# Patient Record
Sex: Female | Born: 1955 | Race: Black or African American | Hispanic: No | Marital: Single | Smoking: Never smoker
Health system: Southern US, Community
[De-identification: ages and names within clinical notes are randomized; demographics above are authoritative.]

## PROBLEM LIST (undated history)

## (undated) DIAGNOSIS — K219 Gastro-esophageal reflux disease without esophagitis: Secondary | ICD-10-CM

## (undated) DIAGNOSIS — D649 Anemia, unspecified: Secondary | ICD-10-CM

## (undated) HISTORY — PX: DILATION AND CURETTAGE OF UTERUS: SHX78

## (undated) HISTORY — PX: MYOMECTOMY: SHX85

---

## 1998-12-14 ENCOUNTER — Ambulatory Visit (HOSPITAL_COMMUNITY): Admission: RE | Admit: 1998-12-14 | Discharge: 1998-12-14 | Payer: Self-pay | Admitting: Obstetrics and Gynecology

## 1998-12-14 ENCOUNTER — Encounter: Payer: Self-pay | Admitting: Obstetrics and Gynecology

## 1999-06-12 ENCOUNTER — Other Ambulatory Visit: Admission: RE | Admit: 1999-06-12 | Discharge: 1999-06-12 | Payer: Self-pay | Admitting: Obstetrics and Gynecology

## 1999-08-18 ENCOUNTER — Ambulatory Visit (HOSPITAL_COMMUNITY): Admission: RE | Admit: 1999-08-18 | Discharge: 1999-08-18 | Payer: Self-pay | Admitting: Obstetrics and Gynecology

## 1999-11-21 ENCOUNTER — Other Ambulatory Visit: Admission: RE | Admit: 1999-11-21 | Discharge: 1999-11-21 | Payer: Self-pay | Admitting: Obstetrics and Gynecology

## 1999-12-17 ENCOUNTER — Encounter: Payer: Self-pay | Admitting: Obstetrics and Gynecology

## 1999-12-17 ENCOUNTER — Ambulatory Visit (HOSPITAL_COMMUNITY): Admission: RE | Admit: 1999-12-17 | Discharge: 1999-12-17 | Payer: Self-pay | Admitting: Obstetrics and Gynecology

## 2000-08-28 ENCOUNTER — Inpatient Hospital Stay (HOSPITAL_COMMUNITY): Admission: RE | Admit: 2000-08-28 | Discharge: 2000-08-30 | Payer: Self-pay | Admitting: Obstetrics and Gynecology

## 2000-08-28 ENCOUNTER — Encounter (INDEPENDENT_AMBULATORY_CARE_PROVIDER_SITE_OTHER): Payer: Self-pay

## 2001-07-15 ENCOUNTER — Other Ambulatory Visit: Admission: RE | Admit: 2001-07-15 | Discharge: 2001-07-15 | Payer: Self-pay | Admitting: Obstetrics and Gynecology

## 2002-04-13 ENCOUNTER — Encounter: Payer: Self-pay | Admitting: Internal Medicine

## 2002-04-13 ENCOUNTER — Ambulatory Visit (HOSPITAL_COMMUNITY): Admission: RE | Admit: 2002-04-13 | Discharge: 2002-04-13 | Payer: Self-pay | Admitting: Internal Medicine

## 2002-05-10 ENCOUNTER — Ambulatory Visit (HOSPITAL_COMMUNITY): Admission: RE | Admit: 2002-05-10 | Discharge: 2002-05-10 | Payer: Self-pay | Admitting: Internal Medicine

## 2002-05-10 ENCOUNTER — Encounter: Payer: Self-pay | Admitting: Internal Medicine

## 2003-02-07 ENCOUNTER — Other Ambulatory Visit: Admission: RE | Admit: 2003-02-07 | Discharge: 2003-02-07 | Payer: Self-pay | Admitting: Obstetrics and Gynecology

## 2003-02-16 ENCOUNTER — Encounter: Payer: Self-pay | Admitting: Obstetrics and Gynecology

## 2003-02-16 ENCOUNTER — Ambulatory Visit (HOSPITAL_COMMUNITY): Admission: RE | Admit: 2003-02-16 | Discharge: 2003-02-16 | Payer: Self-pay | Admitting: Obstetrics and Gynecology

## 2005-03-12 ENCOUNTER — Other Ambulatory Visit: Admission: RE | Admit: 2005-03-12 | Discharge: 2005-03-12 | Payer: Self-pay | Admitting: Obstetrics and Gynecology

## 2005-03-19 ENCOUNTER — Ambulatory Visit (HOSPITAL_COMMUNITY): Admission: RE | Admit: 2005-03-19 | Discharge: 2005-03-19 | Payer: Self-pay | Admitting: Obstetrics and Gynecology

## 2005-06-11 ENCOUNTER — Encounter (INDEPENDENT_AMBULATORY_CARE_PROVIDER_SITE_OTHER): Payer: Self-pay | Admitting: *Deleted

## 2005-06-11 ENCOUNTER — Ambulatory Visit (HOSPITAL_COMMUNITY): Admission: RE | Admit: 2005-06-11 | Discharge: 2005-06-11 | Payer: Self-pay | Admitting: Obstetrics and Gynecology

## 2007-01-14 ENCOUNTER — Ambulatory Visit (HOSPITAL_COMMUNITY): Admission: RE | Admit: 2007-01-14 | Discharge: 2007-01-14 | Payer: Self-pay | Admitting: Obstetrics and Gynecology

## 2007-07-29 ENCOUNTER — Ambulatory Visit (HOSPITAL_COMMUNITY): Admission: RE | Admit: 2007-07-29 | Discharge: 2007-07-29 | Payer: Self-pay | Admitting: Internal Medicine

## 2008-04-12 ENCOUNTER — Ambulatory Visit (HOSPITAL_COMMUNITY): Admission: RE | Admit: 2008-04-12 | Discharge: 2008-04-12 | Payer: Self-pay | Admitting: Obstetrics and Gynecology

## 2010-02-01 IMAGING — MG MM DIGITAL SCREENING BILAT
4 series · 4 of 4 positions shown · non-contrast
Comparison: Prior studies.

DG SCREEN MAMMOGRAM BILATERAL
Bilateral CC and MLO view(s) were taken.
Technologist: Flor, Raffy.(ROSELLON)(M)
Prior study comparison: March 19, 2005, bilateral screening mammogram.

DIGITAL SCREENING MAMMOGRAM WITH CAD:

[R CC]
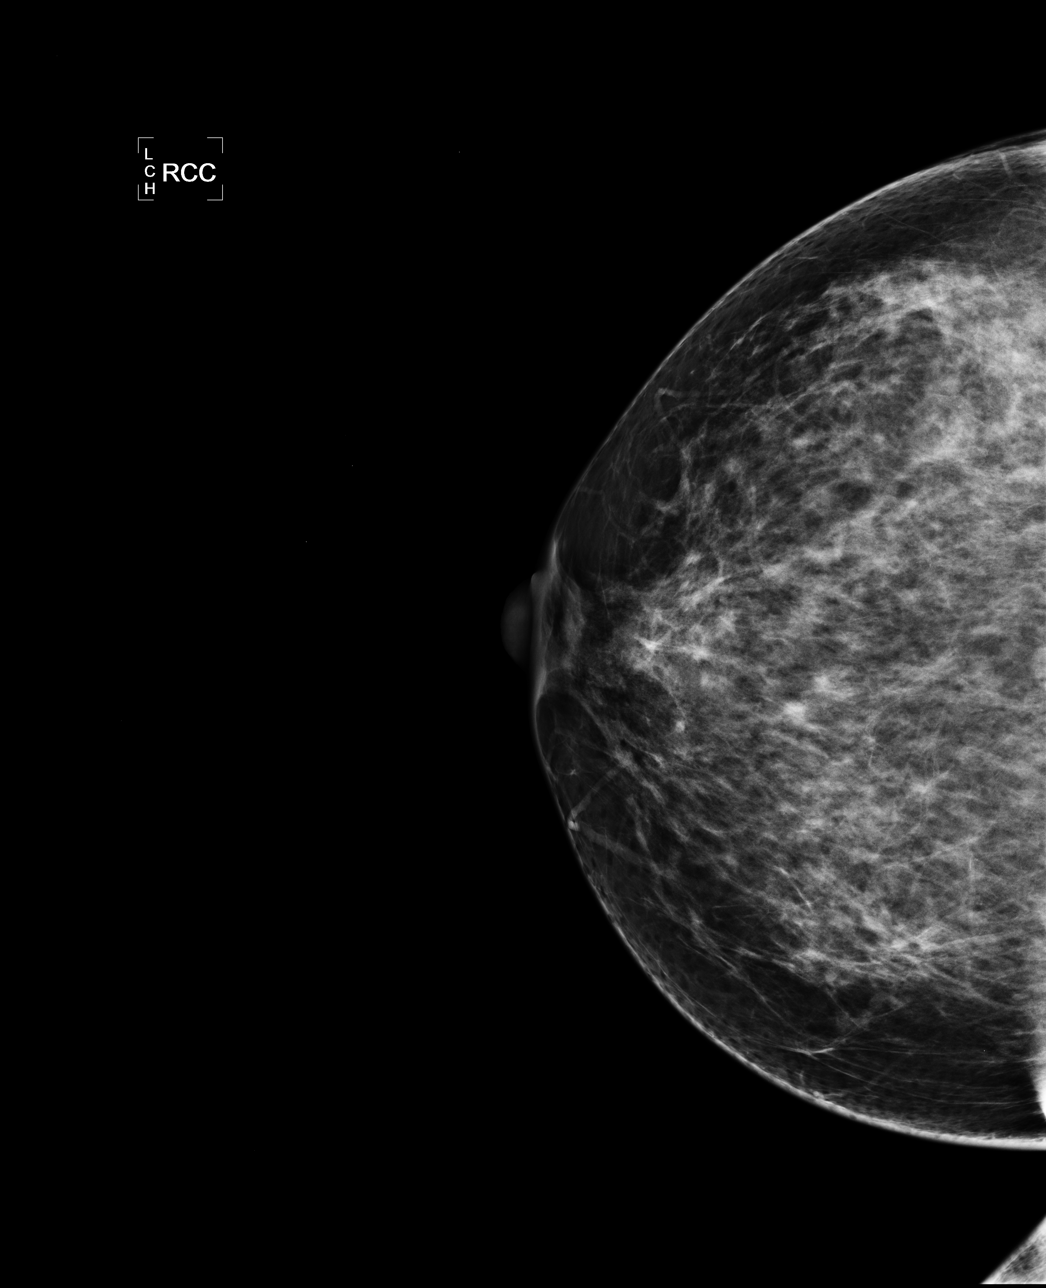

[R MLO]
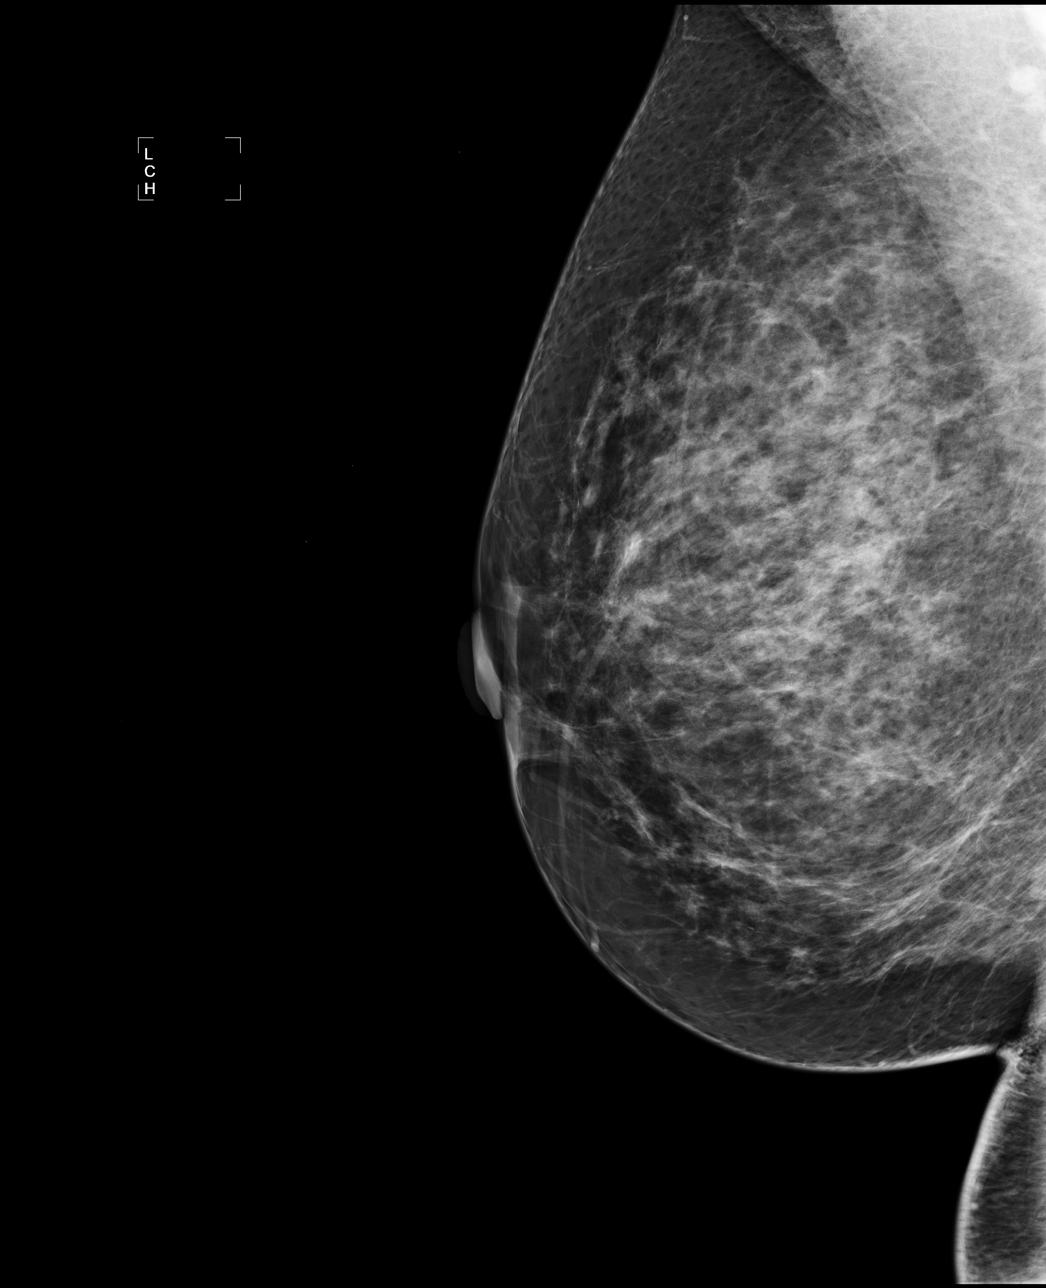

[L CC]
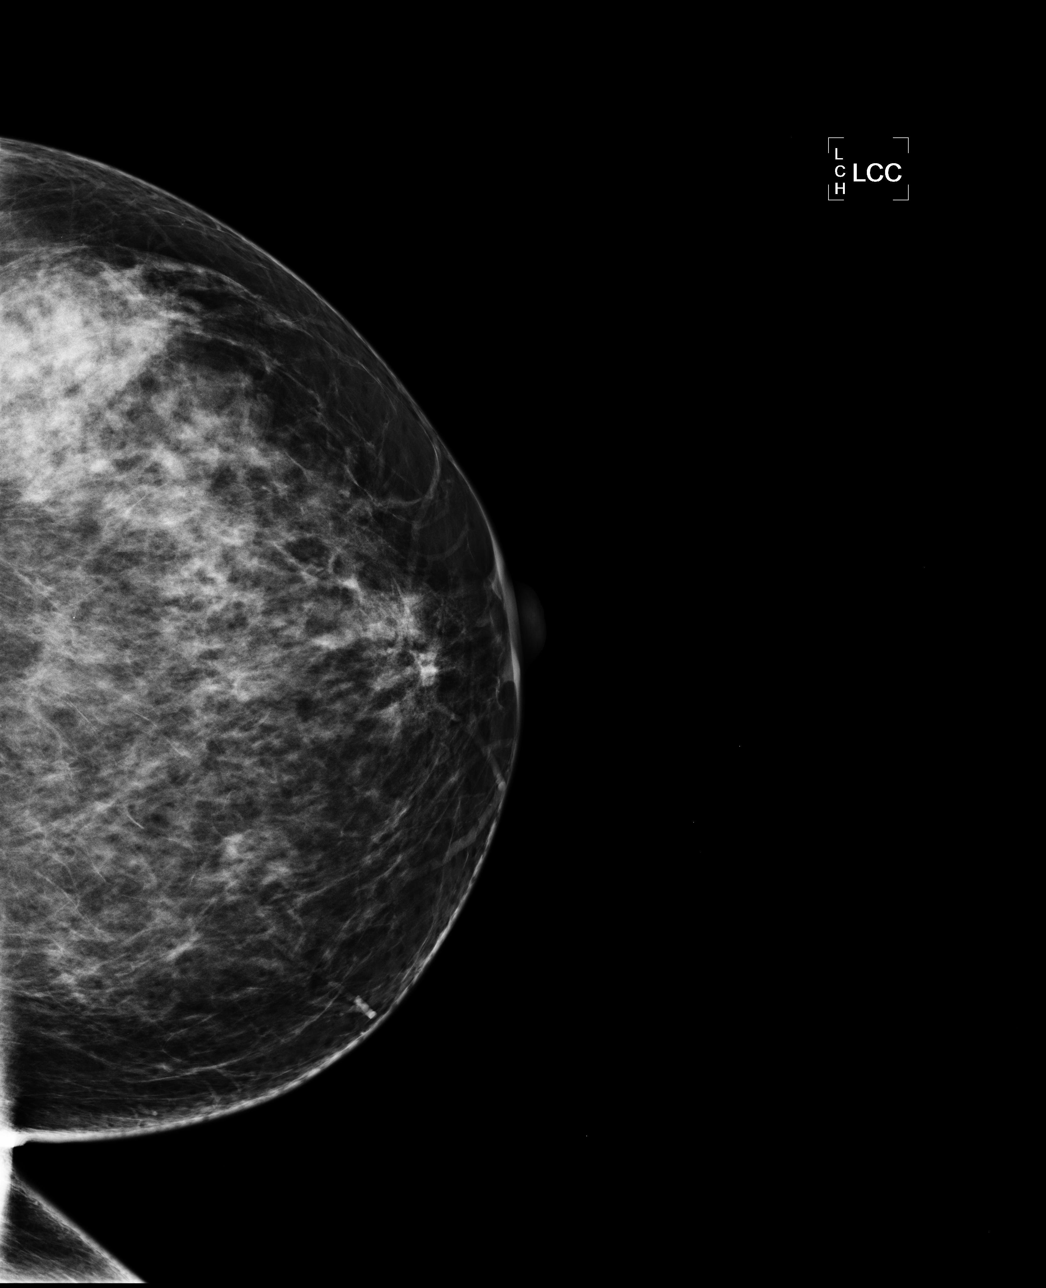

[L MLO]
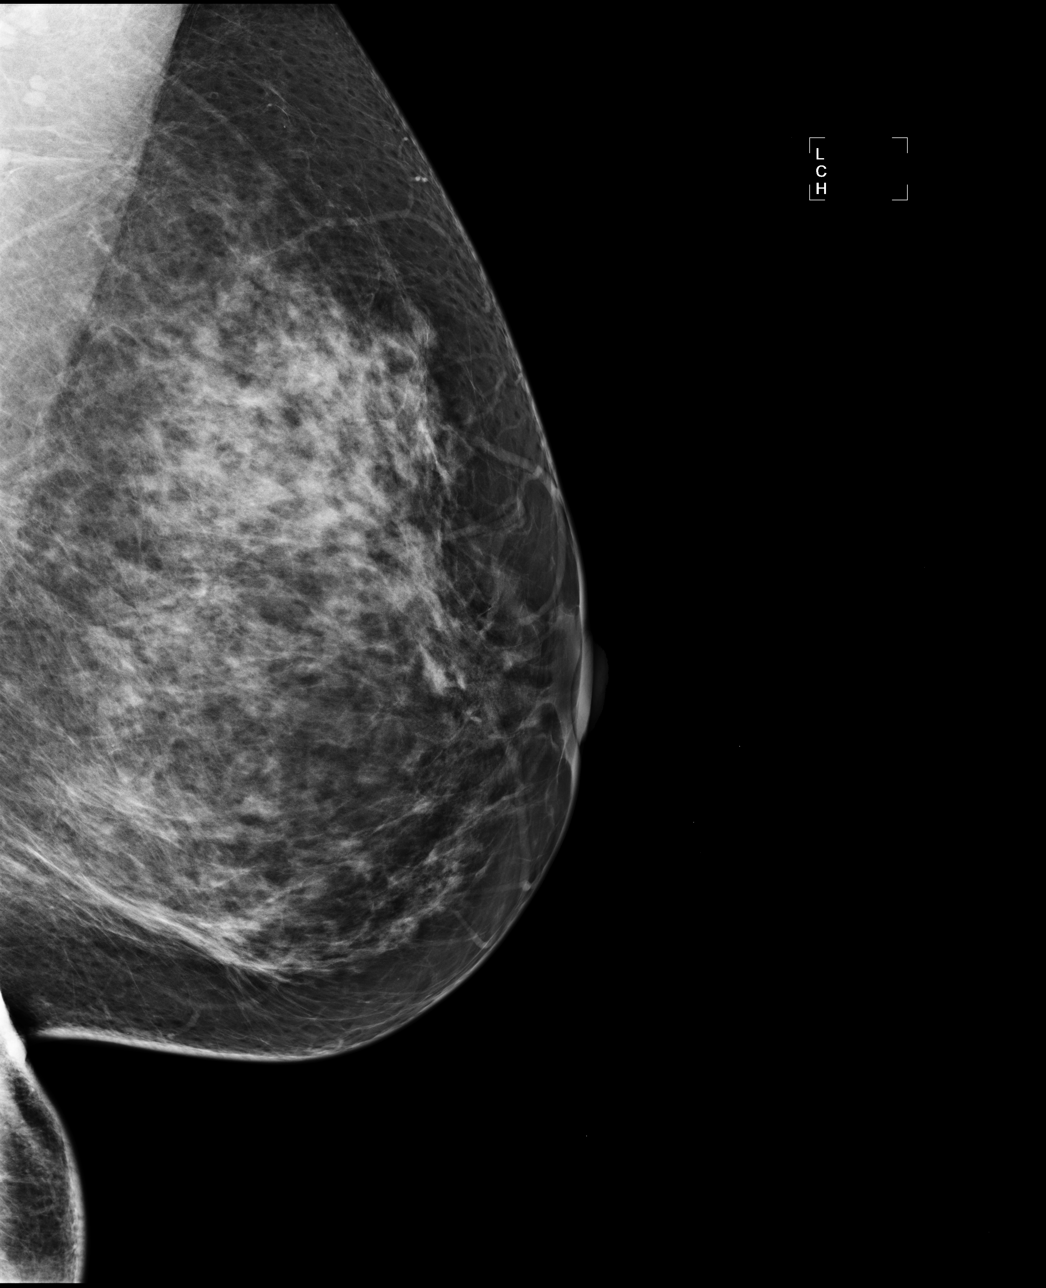

[4 of 4 positions shown; findings below may reference images not displayed]

The breast tissue is heterogeneously dense.  There is no dominant mass, architectural distortion or
calcification to suggest malignancy.
IMPRESSION: No mammographic evidence of malignancy.  Suggest yearly screening mammography.

ASSESSMENT: Negative - BI-RADS 1

Screening mammogram in 1 year.
ANALYZED BY COMPUTER AIDED DETECTION. , THIS PROCEDURE WAS A DIGITAL MAMMOGRAM.

## 2010-06-15 ENCOUNTER — Ambulatory Visit (HOSPITAL_COMMUNITY): Admission: RE | Admit: 2010-06-15 | Discharge: 2010-06-15 | Payer: Self-pay | Admitting: Obstetrics and Gynecology

## 2011-01-18 NOTE — H&P (Signed)
[facility]  Patient:    [name], [name]                 MRN: [id number] Adm. Date:  07/01/00 Attending:  Janine Limbo, M.D.                         History and Physical  HISTORY OF PRESENT ILLNESS:   The patient is a 55 year old female, para 0-0-2-0 who presents for abdominal myomectomy because of an enlarging fibroid uterus.  The patient had a normal Pap smear in March 2001.  Her most recent mammogram was in April 2001 and was within normal limits.  The patient had an endometrial biopsy performed that was benign.  The patient desires to maintain her childbearing potential, although she is not interested in getting pregnant in the immediate future.  The patient was seen in March 2001 and her uterus was noted to be 16 weeks in size, irregular, and firm.  The patient was then seen six months later and her uterus was noted to be 20-22 weeks in size. There is a large fibroid projecting above the umbilicus.  Her most recent hemoglobin was 10.0, but her hemoglobin has been as low as 6.1.  She has heavy menstrual cycles.  She denies any prior history of sexually transmitted infections.  DRUG ALLERGIES:               None.  PAST OBSTETRIC HISTORY:       The patient has had two elective pregnancy terminations.  PAST MEDICAL HISTORY:         The patient denies hypertension and diabetes.  SOCIAL HISTORY:               The patient is single.  She works as an Art gallery manager.  She drinks alcohol socially.  She denies cigarette use and recreational drug use.  REVIEW OF SYSTEMS:            Noncontributory.  FAMILY HISTORY:               The patients mother has hypertension.  PHYSICAL EXAMINATION:  VITAL SIGNS:                  Weight 183 pounds, height 5 feet 7 inches.  HEENT:                        Within normal limits.  CHEST:                        Clear.  HEART:                        Regular rate and rhythm.  BREASTS:                       Without masses.  ABDOMEN:                      Nontender.  There is a firm mass present that projects approximately 5 cm above the umbilicus.  EXTREMITIES:                  Within normal limits.  NEUROLOGIC:                   Normal.  PELVIC:  External genitalia normal.  The vagina is normal.  The cervix is nontender.  The uterus is 22 week sized, irregular and firm.  Adnexa no masses.  Rectovaginal exam confirms.  ASSESSMENT:                   1. Enlarging fibroid uterus.                               2. Menorrhagia.                               3. Anemia.  PLAN:                         The patient will undergo an abdominal myomectomy.  She understands the indications for her procedure and she accepts the risks of, but not limited to, anesthesia complications, bleeding, infection and possible damage to surrounding organs. DD:  06/15/00 TD:  06/15/00 Job: [id number]

## 2011-01-18 NOTE — Discharge Summary (Signed)
[facility]  Patient:    [name], [name]               MRN: [id number] Adm. Date:  [id number] Disc. Date: [id number] Attending:  Leonard Schwartz Dictator:   Henreitta Leber, P.A.                           Discharge Summary  DISCHARGE DIAGNOSES:          1. Enlarging fibroid uterus (22 week size).                               2. Menorrhagia.                               3. Anemia.  PROCEDURE:                    On August 28, 2000 the patient underwent an abdominal myomectomy, tolerating the procedure well.  HISTORY OF PRESENT ILLNESS:   [name] is a 55 year old female, para 0-0-2-0, who presents for myomectomy because of an enlarging fibroid uterus. The patient has also been symptomatic for menorrhagia but wishes to maintain her child bearing potential. Please see the patients dictated history and physical exam for details.  PHYSICAL EXAMINATION:  VITAL SIGNS:                  Weight is 183 pounds. Height is 5 feet 7inches tall.  GENERAL:                      Within normal limits with the exception of her abdominal exam which is nontender; however, there is a firm mass present that projects above the umbilicus approximately 5 cm.  PELVIC:                       EGBUS is within normal limits. The vagina is normal. The cervix is nontender. Uterus is approximately 22 week size, irregular and firm. Adnexa without masses. Rectovaginal exam confirms.  HOSPITAL COURSE:              On August 28, 2000 the patient underwent an abdominal myomectomy at which time approximately 18 uterine fibroids were removed with the largest measuring approximately 10 x 7 x 5 cm; the patient tolerated the procedure well. Postoperative course was marked by a maximum temperature of 101.6 degrees Fahrenheit on postoperative day #1; however, this was attributed to the patients procedure. The patients postoperative hemoglobin was 8.1 (preoperative  hemoglobin 10.3). On postoperative day #2 the patient was believed to have received the maximum benefit of her hospital stay with her resuming a normal temperature, bowel and bladder functions, and therefore discharged to home.  DISCHARGE MEDICATIONS:        1. Vicodin one to two tablets every four to six                                  hours as needed for pain.                               2. Ibuprofen 600 mg one tablet every six hours  with food for five days.                               3. Iron 325 mg one tablet three times a day for                                  six weeks.                               4. Stool softeners 60 mg one tablet twice daily                                  while taking iron.                               5. Phenergan 25 mg one tablet four times a day                                  as needed for nausea.  FOLLOWUP INSTRUCTIONS:        The patient is to call [facility] for staple removal on September 01, 2000. She is also to schedule a six weeks postoperative appointment with Dr. Leonard Schwartz.  DISCHARGE INSTRUCTIONS:       The patient was given a copy of [address] [facility] postoperative instruction sheet. She was further advised to avoid driving for two weeks, heavy lifting for four weeks, and intercourse for six weeks.  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 DD:  09/21/00 TD:  09/22/00 Job: [id number]

## 2011-01-18 NOTE — H&P (Signed)
NAMEGENNELL, HOW NO.:  [id number]   MEDICAL RECORD NO.:  [id number]          PATIENT TYPE:  AMB   LOCATION:  SDC                           FACILITY:  [facility]   PHYSICIAN:  Janine Limbo, M.D.DATE OF BIRTH:  [birth year]   DATE OF ADMISSION:  DATE OF DISCHARGE:                                HISTORY & PHYSICAL   HISTORY OF PRESENT ILLNESS:  Ms. Miles is a 55 year old female, para 0-  0-2-0 who presents for hysteroscopy with dilatation and curettage.  The  patient has a history of heavy and irregular bleeding.  The patient also has  a known history of fibroids.  The patient had a myomectomy in 2001.  Her  most recent hemoglobin was 7.1.  Her hematocrit was 26.8.  Her thyroid tests  were normal.  GC and Chlamydia cultures were negative.  Her most recent Pap  smear was within normal limits.  The patient does desire to maintain her  childbearing potential.   ALLERGIES:  No known drug allergies.   OBSTETRICAL HISTORY:  The patient has had two elective pregnancy  terminations.   PAST MEDICAL HISTORY:  The patient denies hypertension and diabetes.   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  She  drinks alcohol socially.  She denies cigarette use and recreational drug  use.   REVIEW OF SYSTEMS:  Noncontributory.   FAMILY HISTORY:  The patient's mother has hypertension.   PHYSICAL EXAMINATION:  VITAL SIGNS:  Height is 5 feet 7 inches, weight is  185 pounds.  HEENT:  Within normal limits.  CHEST:  Clear.  HEART:  Regular rate and rhythm.  BREASTS:  Without masses.  ABDOMEN:  Nontender.  EXTREMITIES:  Within normal limits.  NEUROLOGIC:  Exam is grossly normal.  PELVIC:  External genitalia are normal.  Vagina is normal.  Cervix is  nontender.  Uterus is 14 weeks' size, irregular.  Adnexa:  No masses.  Rectovaginal exam confirms.   ASSESSMENT:  1.  Menorrhagia.  2.  Severe anemia.  3.  Fibroids.   PLAN:  The patient will  undergo a hysteroscopy with dilatation and  curettage.  She understands the indications for her surgical procedure and  she accepts the risks of, but not limited to, anesthetic complications,  bleeding, infections, and possible damage to the surrounding organs.      Janine Limbo, M.D.  Electronically Signed     AVS/MEDQ  D:  06/10/2005  T:  06/10/2005  Job:  [id number]

## 2011-01-18 NOTE — Op Note (Signed)
[facility]  Patient:    [name]                MRN: [id number] Proc. Date: 08/18/99 Adm. Date:  [id number] Attending:  Leonard Schwartz                           Operative Report  PREOPERATIVE DIAGNOSES:       1. Menometrorrhagia.                               2. Fibroid.                               3. Severe anemia (hemoglobin 7.3).  POSTOPERATIVE DIAGNOSES:      1. Menometrorrhagia.                               2. Fibroid.                               3. Severe anemia (hemoglobin 7.3).  OPERATION:                    Diagnostic hysteroscopy with dilatation and                               curettage.  SURGEON:                      Janine Limbo, M.D.  ANESTHESIA:                   IV sedation and paracervical block using 1%                               Xylocaine.  INDICATIONS:                  The patient is a 55 year old who presents with the above mentioned diagnosis.  Hormonal replacement therapy had failed to control er symptoms.  The patient understands the indications for her surgical procedure and she accepts the risks of, but not limited to, anesthesia complications, bleeding, infection, and possible damage to the surrounding organs.  FINDINGS:                     The preoperative uterus was noted to be 16-18 weeks size and irregular.  The uterus sounded to 14.5 cm.  There were no polyps in the endometrial cavity.  There was a posterior fibroid that seemed to measure 5 cm.  The tubal ostia were visualized.  DESCRIPTION OF PROCEDURE:     The patient was taken to the operating room where she was given medication through her IV line.  The perineum and vagina were prepped  with Betadine.  The bladder was drained of urine.  Examination of anesthesia was performed.  The patient was sterilely draped.  A paracervical block was placed using 10 cc of 1% Xylocaine without epinephrine.  The endocervical  curettage was then obtained.  The uterus sounded to 14.5 cm.  The cervix was gradually dilated. The uterine cavity was investigated  using the diagnostic hysteroscope. Pictures were taken of the intrauterine environment.  The cavity was then curetted until it was felt to be completely clean.  The postoperative uterus was firm and hemostasis was adequate.  The estimated blood loss was 15 cc.  The Sorbitol deficit was 70 cc. The patient tolerated her procedure well.  All instruments were removed and the  patient was returned to the supine position.  She was taken to the recovery room in stable condition.  FOLLOWUP INSTRUCTIONS:        The patient will return to see Janine Limbo, M.D. in two to three weeks for follow up examination.  She was given Vicodin one to two p.o. q.4h. p.r.n. pain.  She was given a copy of the postoperative instruction sheet _________ by the [facility] for patients who have undergone hysteroscopy and D&C. DD:  08/18/99 TD:  08/21/99 Job: [id number]

## 2011-01-18 NOTE — Op Note (Signed)
[name], [name] NO.:  [id number]   MEDICAL RECORD NO.:  [id number]          PATIENT TYPE:  AMB   LOCATION:  SDC                           FACILITY:  [facility]   PHYSICIAN:  Janine Limbo, M.D.DATE OF BIRTH:  1956-04-10   DATE OF PROCEDURE:  06/11/2005  DATE OF DISCHARGE:                                 OPERATIVE REPORT   PREOPERATIVE DIAGNOSES:  1.  Fibroid uterus.  2.  Menometrorrhagia.  3.  Anemia (hemoglobin 7.0).   POSTOPERATIVE DIAGNOSIS:  1.  Fibroid uterus.  2.  Menometrorrhagia.  3.  Anemia (hemoglobin 7.0).   PROCEDURES:  1.  Hysteroscopy with resection of a submucosal fibroid.  2.  Dilatation and curettage.   SURGEON:  Janine Limbo, M.D.   FIRST ASSISTANT:  None.   ANESTHETIC:  Monitored anesthetic control and paracervical block using 0.5%  Marcaine with epinephrine.   DISPOSITION:  [name] is a 55 year old female, para 0-0-2-0, who  presents with the above-mentioned diagnosis.  The patient understands the  indications for her surgical procedure and she accepts the risks of, but not  limited to, anesthetic complications, bleeding, infection, and possible  damage to the surrounding organs.  The patient does not want to have a  hysterectomy performed.   FINDINGS:  The the patient's uterus was 14-16 weeks' size.  The uterus  sounded to 11 cm.  A 3 x 4 cm submucosal fibroid was noted on hysteroscopy.  There was no evidence of other pathology.   PROCEDURE:  The patient was taken to the operating room, where the patient  was given medication through her IV line.  The patient's perineum and vagina  were prepped with multiple layers of Betadine.  The patient was sterilely  draped.  The bladder was drained of urine.  Examination under anesthesia was  performed.  A paracervical block was then placed using 10 mL of 0.5%  Marcaine with epinephrine.  Another 10 mL of 0.5% Marcaine with epinephrine  were injected directly into the  cervix.  An endocervical curettage was  performed.  The cervix was dilated.  The diagnostic hysteroscope was  inserted and pictures were made of the intrauterine environment.  The  diagnostic hysteroscope was removed and the cervix was further dilated.  The  operative hysteroscope was inserted.  We then began the resection of the  submucosal fibroid.  This was a lengthy procedure, and the procedure was  complicated by bleeding.  Also. the procedure was complicated by the fact  that the small pieces of fibroid had to be removed through the hysteroscope.  The resection lasted approximately 1 hour 45 minutes.  We had removed two-  thirds of the fibroid when we noticed that our fluid deficit registered 600  on the meter.  We did understand that there was approximately 200 mL of  sorbitol on the operating room floor, in the drapes, and on the operator's  clothing.  The decision was made to terminate the procedure at this point.  The final reading on the meter for the sorbitol was 560 mL.  Hemostasis was  adequate.  All instruments were removed.  The  uterus was again examined and there was no evidence of damage.  A Foley  catheter had previously been placed because of the length of the operative  procedure.  The patient was noted to drain clear yellow urine.  The patient  was then returned to the supine position.  Her anesthetic was reversed.  She  was taken to the recovery room in stable condition.  She tolerated her  procedure well.   FOLLOW-UP INSTRUCTIONS:  The patient will take Vicodin one or two tablets  every four hours as needed for pain.  She will return to see Dr. Stefano Gaul in  two to three weeks for follow-up examination.  She was given a copy of the  postoperative instruction sheet as prepared by the [facility]  [facility] for patients who have undergone a dilatation and curettage.  We  will see how the patient does in terms of her bleeding.  If the patient wishes to proceed  with further surgical management, then we  will consider resecting the remainder of the fibroid or we will consider a  myomectomy or hysterectomy.  If the patient does well and her bleeding is  not severe, then we will have the patient take iron tablets and we will  follow her as an outpatient.      Janine Limbo, M.D.  Electronically Signed     AVS/MEDQ  D:  06/11/2005  T:  06/11/2005  Job:  [id number]

## 2011-01-18 NOTE — Discharge Summary (Signed)
[facility]  Patient:    [name], [name]               MRN: [id number] Adm. Date:  [id number] Disc. Date: [id number] Attending:  Leonard Schwartz Dictator:   Marquis Lunch. Adline Peals.                           Discharge Summary  DISCHARGE DIAGNOSES:          1. Enlarging fibroid uterus (22 weeks size).                               2. Menorrhagia.                               3. Anemia.  PROCEDURE:                    On the date of admission patient underwent an abdominal myomectomy, tolerating procedure well.  HISTORY OF PRESENT ILLNESS:   [name] is a 55 year old female para 0-0-2-0 who presents for abdominal myomectomy because of an enlarging fibroid uterus.  Patient has also been symptomatic for menorrhagia, but wishes to maintain her childbearing potential.  Please see patients dictated history and physical examination for details.  PHYSICAL EXAMINATION:  VITAL SIGNS:                  Weight 183 pounds, height 5 feet 7 inches.  GENERAL:                      Within normal limits.  ABDOMEN:                      Nontender.  However, there is a firm mass present that projects above the umbilicus approximately 5 cm.  PELVIC:                       EGBUS is within normal limits.  Vagina is normal.  Cervix is nontender.  Uterus is approximately 22 weeks size, irregular, and firm.  Adnexa is without masses.  Rectovaginal examination confirms.  HOSPITAL COURSE:              On the date of admission patient underwent an abdominal myomectomy at which time approximately 18 uterine fibroids were removed with the largest measuring approximately 10 cm x 7 cm x 5 cm.  Patient tolerated procedure well.  Postoperative course was marked by a maximum temperature of 101.6 degrees Fahrenheit on postoperative day #1, however, this was attributed to patients procedure.  Patients postoperative hemoglobin was 8.1 (preoperative hemoglobin 10.3).   On postoperative day #2 patient was believed to have received the maximum benefit of her hospital stay with her resuming a normal temperature, bowel and bladder functions, and was therefore discharged to home.  DISCHARGE MEDICATIONS:        1. Vicodin one to two tablets q.4-6h. as needed                                  for pain.  2. Ibuprofen 600 mg one tablet q.6h. with food                                  for five days.                               3. Iron 325 mg one tablet t.i.d. for six weeks.                               4. Stool softeners 60 mg one tablet b.i.d. while                                  taking iron.                               5. Phenergan 25 mg one tablet q.i.d. as needed                                  for nausea.  FOLLOW-UP:                    Patient is to call [facility] for staple removal on October 02, 2000.  She is also to schedule a six week postoperative appointment with Dr. Leonard Schwartz.  DISCHARGE INSTRUCTIONS:       Patient was given a copy of [facility] postoperative instruction sheet.  She was further advised to avoid driving for two weeks, heavy lifting for four weeks, and intercourse for six weeks.  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 DD:  09/22/00 TD:  09/23/00 Job: [id number]

## 2011-01-18 NOTE — Op Note (Signed)
[facility]  Patient:    [name], [name]               MRN: [id number] Proc. Date: 08/28/00 Adm. Date:  [id number] Attending:  Leonard Schwartz                           Operative Report  PREOPERATIVE DIAGNOSES:       1. A 22 week sized fibroid uterus.                               2. Menometrorrhagia.                               3. Anemia (hemoglobin is 10.3).  POSTOPERATIVE DIAGNOSES:      1. A 22 week sized fibroid uterus.                               2. Menometrorrhagia.                               3. Anemia (hemoglobin is 10.3).  PROCEDURE:                    Multiple abdominal myomectomy.  SURGEON:                      Janine Limbo, M.D.  FIRST ASSISTANT:              Henreitta Leber, P.A.  ANESTHESIA:                   General.  INDICATIONS:                  The patient is a 55 year old female, para 0-0-2-0, who presents with the above mentioned diagnoses.  She wishes to maintain her childbearing potential.  She understands the options for management, but she wants to proceed with myomectomy.  She understands the indications for this procedure and she accepts the risks of but not limited to anesthetic complications, bleeding, infection, and possible damage to surrounding organs.  FINDINGS:                     The patient had a 22 week sized multifibroid uterus.  A total of 18 fibroids were removed, with the largest measuring 10 cm x 7 cm x 5 cm.  Multiple incisions were made in the uterus that extended deep into the myometrium.  The patient should have cesarean delivery should she become pregnant.  DESCRIPTION OF PROCEDURE:     The patient was taken to the operating room, where a general anesthetic was given.  The patients abdomen, perineum and vagina were prepped with multiple layers of Betadine.  A Foley catheter was placed in the bladder.  The patient was sterilely draped.  A midline incision was made and  carried sharply through the subcutaneous tissue, the fascia and the anterior peritoneum.  The fibroid uterus was elevated into our operative field.  Pictures were taken.  The fibroids were injected with a diluted solution of Pitressin and saline.  Incisions were made over the fibroids and each fibroid was removed using a combination of sharp and  blunt dissection. Incisions were made on the fundus of the uterus, on the posterior surface of the uterus and on the anterior surface of the uterus.  Care was taken not to damage the fallopian tubes.  The defects in the uterus were closed using deep sutures of 0 Vicryl and then running sutures of 3-0 Vicryl through the serosa of the uterus.  The patient tolerated her procedure well.  The estimated blood loss was 400 cc.  The patient had 200 cc of clear urine throughout her procedure.  At the end of the procedure, Interceed was stitched over each incision in hopes of decreasing the adhesions that form.  The pelvis was irrigated.  Hemostasis was noted to be adequate throughout.  The anterior peritoneum, the fascia, and the abdominal musculature were reapproximated in the midline using a Smead-Jones suture of 0 PDS from the corners to the midline.  The subcutaneous area was irrigated.  Hemostasis was adequate.  The subcutaneous area was closed using a running suture of 3-0 Vicryl.  The skin was reapproximated using skin staples.  Sponge, needle and instrument counts were correct on two occasions.  The patient was awakened from her anesthetic and then taken to the recovery room in stable condition. DD:  08/28/00 TD:  08/28/00 Job: [id number]

## 2011-01-18 NOTE — H&P (Signed)
[facility]  Patient:    [name], [name]                 MRN: [id number] Adm. Date:  08/28/00 Attending:  Janine Limbo, M.D.                         History and Physical  HISTORY OF PRESENT ILLNESS:   The patient is a 55 year old female, para 0-0-2-0, who presents for an abdominal myomectomy because of an enlarging fibroid uterus.  Patient had a normal Pap smear in March of 2001.  She had a normal mammogram in April of 2001.  The patient had a benign endometrial biopsy.  The patient desires to maintain her childbearing potential, although she does not have any immediate plans for getting pregnant.  The patient was seen in March of 2000 and her uterus was noted to be 16-weeks-size, irregular and firm.  The patient was then seen six months later and her uterus was noted to be 20- to 22-weeks-size.  There is a large fibroid projecting above the umbilicus.  Her most recent hemoglobin was 7.5 but her preoperatively hemoglobin is 10.3.  Her hemoglobin has been as low at 6.1.  She has very heavy cycles.  She denies a history of sexually transmitted infections.  DRUG ALLERGIES:               None.  OBSTETRICAL HISTORY:          The patient has had two elective pregnancy terminations.  PAST MEDICAL HISTORY:         The patient denies hypertension and diabetes.  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  She drinks alcohol socially.  She denies cigarette use and recreational drug use.  REVIEW OF SYSTEMS:            Noncontributory.  FAMILY HISTORY:               The patients mother has hypertension.  PHYSICAL EXAMINATION  GENERAL:                      Weight is 183 pounds.  Height is 5 feet 7 inches.  HEENT:                        Within normal limits.  CHEST:                        Clear.  HEART:                        Regular rate and rhythm.  BREASTS:                      Her breasts are  without masses.  ABDOMEN:                      Her abdomen is nontender and there is a firm mass present that projects above the umbilicus by about 5 cm.  EXTREMITIES:                  Within normal limits.  NEUROLOGIC:                   Normal.  PELVIC:  External genitalia is normal.  The vagina is normal.  The cervix is nontender.  The uterus is approximately 22-weeks-size, irregular and firm.  Adnexa:   No masses.  Rectovaginal exam confirms.  ASSESSMENT:                   1. Enlarging fibroid uterus (22-weeks-size).                               2. Menorrhagia.                               3. Anemia.  PLAN:                         The patient will undergo an abdominal myomectomy.  She understands the indications for her procedure and she accepts the risks of, but not limited to, anesthetic complications, bleeding, infections, and possible damage to the surrounding organs.  DD:  08/27/00 TD:  08/28/00 Job: [id number]

## 2011-06-25 ENCOUNTER — Other Ambulatory Visit: Payer: Self-pay | Admitting: Obstetrics and Gynecology

## 2011-09-06 ENCOUNTER — Encounter (HOSPITAL_COMMUNITY): Payer: Self-pay | Admitting: Pharmacist

## 2011-09-10 ENCOUNTER — Encounter (HOSPITAL_COMMUNITY)
Admission: RE | Admit: 2011-09-10 | Discharge: 2011-09-10 | Disposition: A | Discharge status: Home / Self Care | Payer: BC Managed Care – PPO | Source: Ambulatory Visit | Attending: Obstetrics and Gynecology | Admitting: Obstetrics and Gynecology

## 2011-09-10 ENCOUNTER — Encounter (HOSPITAL_COMMUNITY): Payer: Self-pay

## 2011-09-10 HISTORY — DX: Gastro-esophageal reflux disease without esophagitis: K21.9

## 2011-09-10 HISTORY — DX: Anemia, unspecified: D64.9

## 2011-09-10 LAB — CBC
Hemoglobin: 13.1 g/dL (ref 12.0–15.0)
MCH: 26.4 pg (ref 26.0–34.0)
RDW: 14.7 % (ref 11.5–15.5)

## 2011-09-10 NOTE — Patient Instructions (Addendum)
YOUR PROCEDURE IS SCHEDULED ON:09/18/11  ENTER THROUGH THE MAIN ENTRANCE OF [facility] AT:8am Wed USE DESK PHONE AND DIAL [phone] TO INFORM us OF YOUR ARRIVAL  CALL [phone] IF YOU HAVE ANY QUESTIONS OR PROBLEMS PRIOR TO YOUR ARRIVAL.  REMEMBER: DO NOT EAT OR DRINK AFTER MIDNIGHT :Tuesday  SPECIAL INSTRUCTIONS:   YOU MAY BRUSH YOUR TEETH THE MORNING OF SURGERY   TAKE THESE MEDICINES THE DAY OF SURGERY WITH SIP OF WATER: none   DO NOT WEAR JEWELRY, EYE MAKEUP, LIPSTICK OR DARK FINGERNAIL POLISH DO NOT WEAR LOTIONS  DO NOT SHAVE FOR 48 HOURS PRIOR TO SURGERY  YOU WILL NOT BE ALLOWED TO DRIVE YOURSELF HOME.  NAME OF DRIVER:mother

## 2011-09-12 NOTE — H&P (Signed)
NAMESAVANHA, IS[place] NO.:  [id number]  MEDICAL RECORD NO.:  [id number]  LOCATION:  [facility]                           FACILITY:  [facility]  PHYSICIAN:  Janine Limbo, M.D.DATE OF BIRTH:  July 27, 1956  DATE OF ADMISSION:  09/10/2011 DATE OF DISCHARGE:  09/10/2011                             HISTORY & PHYSICAL   DATE OF SURGERY:  September 18, 2011.  HISTORY OF PRESENT ILLNESS:  Ms. Boutwell is a 56 year old female, para 0-0-2-0, who presents for hysteroscopy with resection and dilatation and curettage.  The patient has been followed at the [facility].  The patient complains of postmenopausal bleeding.  Her evaluation included an ultrasound, which showed a 12.74 x 7.68 multi- fibroid uterus.  A hydro sonogram showed a submucosal fibroid as well as submucosal polyps.  An endometrial biopsy was benign.  The patient had an abdominal myomectomy in 2001.  She had hysteroscopy with dilatation and curettage in 2006.  DRUG ALLERGIES:  No known drug allergies, Betadine allergies, or latex allergies.  OBSTETRICAL HISTORY:  The patient has had 2 elective pregnancy terminations.  PAST MEDICAL HISTORY:  The patient denies hypertension and diabetes. She takes vitamins.  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 She drinks alcohol socially.  She denies cigarette use and other recreational drug uses.  REVIEW OF SYSTEMS:  Please see history of present illness.  FAMILY HISTORY:  Noncontributory.  PHYSICAL EXAMINATION:  VITAL SIGNS:  Height is 5 feet, 7 inches, weight is 210 pounds. HEENT:  Within normal limits. CHEST:  Clear. HEART:  Regular rate and rhythm. BREASTS:  Without masses. ABDOMEN:  Nontender. EXTREMITIES:  Grossly normal and her neurologic exam is grossly normal. PELVIC:  External genitalia is normal.  Vagina is normal.  Cervix is nontender.  Uterus is 14-week  size and irregular.  Adnexa, no masses. Rectovaginal exam confirms.  ASSESSMENT: 1. Postmenopausal bleeding. 2. Fibroid uterus. 3. Endometrial polyps.  PLAN:  The patient will undergo hysteroscopy with resection followed by dilatation and curettage.  She understands the indications for her surgical procedure, and she accepts the risks of, but not limited to, anesthetic complications, bleeding, infections, and possible damage to the surrounding organs.     Janine Limbo, M.D.     AVS/MEDQ  D:  09/11/2011  T:  09/12/2011  Job:  [id number]

## 2011-09-18 ENCOUNTER — Other Ambulatory Visit: Payer: Self-pay | Admitting: Obstetrics and Gynecology

## 2011-09-18 ENCOUNTER — Encounter (HOSPITAL_COMMUNITY): Payer: Self-pay | Admitting: Anesthesiology

## 2011-09-18 ENCOUNTER — Ambulatory Visit (HOSPITAL_COMMUNITY)
Admission: RE | Admit: 2011-09-18 | Discharge: 2011-09-18 | Disposition: A | Discharge status: Home / Self Care | Payer: BC Managed Care – PPO | Source: Ambulatory Visit | Attending: Obstetrics and Gynecology | Admitting: Obstetrics and Gynecology

## 2011-09-18 ENCOUNTER — Encounter (HOSPITAL_COMMUNITY)
Admission: RE | Disposition: A | Discharge status: Home / Self Care | Payer: Self-pay | Source: Ambulatory Visit | Attending: Obstetrics and Gynecology

## 2011-09-18 ENCOUNTER — Ambulatory Visit (HOSPITAL_COMMUNITY): Payer: BC Managed Care – PPO | Admitting: Anesthesiology

## 2011-09-18 DIAGNOSIS — N840 Polyp of corpus uteri: Secondary | ICD-10-CM | POA: Insufficient documentation

## 2011-09-18 DIAGNOSIS — N950 Postmenopausal bleeding: Secondary | ICD-10-CM | POA: Insufficient documentation

## 2011-09-18 DIAGNOSIS — Z01818 Encounter for other preprocedural examination: Secondary | ICD-10-CM | POA: Insufficient documentation

## 2011-09-18 DIAGNOSIS — Z01812 Encounter for preprocedural laboratory examination: Secondary | ICD-10-CM | POA: Insufficient documentation

## 2011-09-18 LAB — PREGNANCY, URINE: Preg Test, Ur: NEGATIVE

## 2011-09-18 SURGERY — DILATATION & CURETTAGE/HYSTEROSCOPY WITH RESECTOCOPE
Anesthesia: General | Site: Vagina | Wound class: Clean Contaminated

## 2011-09-18 MED ORDER — EPHEDRINE 5 MG/ML INJ
INTRAVENOUS | Status: AC
  Filled 2011-09-18: qty 10

## 2011-09-18 MED ORDER — MIDAZOLAM HCL 2 MG/2ML IJ SOLN
INTRAMUSCULAR | Status: AC
  Filled 2011-09-18: qty 2

## 2011-09-18 MED ORDER — IBUPROFEN 200 MG PO TABS: 800.0000 mg | ORAL_TABLET | Freq: Three times a day (TID) | ORAL | Status: AC | PRN

## 2011-09-18 MED ORDER — KETOROLAC TROMETHAMINE 30 MG/ML IJ SOLN
INTRAMUSCULAR | Status: DC | PRN
  Administered 2011-09-18 (×2): 30 mg via INTRAMUSCULAR

## 2011-09-18 MED ORDER — PROMETHAZINE HCL 12.5 MG PO TABS: 12.5000 mg | ORAL_TABLET | Freq: Four times a day (QID) | ORAL | Status: AC | PRN

## 2011-09-18 MED ORDER — HYDROCODONE-ACETAMINOPHEN 5-500 MG PO TABS: 1.0000 | ORAL_TABLET | ORAL | Status: AC | PRN

## 2011-09-18 MED ORDER — IBUPROFEN 200 MG PO TABS: 800.0000 mg | ORAL_TABLET | Freq: Three times a day (TID) | ORAL | Status: DC | PRN

## 2011-09-18 MED ORDER — LACTATED RINGERS IV SOLN
INTRAVENOUS | Status: DC
  Administered 2011-09-18 (×2): via INTRAVENOUS

## 2011-09-18 MED ORDER — MIDAZOLAM HCL 5 MG/5ML IJ SOLN
INTRAMUSCULAR | Status: DC | PRN
  Administered 2011-09-18: 2 mg via INTRAVENOUS

## 2011-09-18 MED ORDER — ONDANSETRON HCL 4 MG/2ML IJ SOLN
INTRAMUSCULAR | Status: DC | PRN
  Administered 2011-09-18: 4 mg via INTRAVENOUS

## 2011-09-18 MED ORDER — PROPOFOL 10 MG/ML IV EMUL
INTRAVENOUS | Status: DC | PRN
  Administered 2011-09-18: 200 mg via INTRAVENOUS

## 2011-09-18 MED ORDER — DEXAMETHASONE SODIUM PHOSPHATE 10 MG/ML IJ SOLN
INTRAMUSCULAR | Status: AC
  Filled 2011-09-18: qty 1

## 2011-09-18 MED ORDER — DEXAMETHASONE SODIUM PHOSPHATE 10 MG/ML IJ SOLN
INTRAMUSCULAR | Status: DC | PRN
  Administered 2011-09-18: 10 mg via INTRAVENOUS

## 2011-09-18 MED ORDER — FENTANYL CITRATE 0.05 MG/ML IJ SOLN
INTRAMUSCULAR | Status: AC
  Filled 2011-09-18: qty 2

## 2011-09-18 MED ORDER — PROPOFOL 10 MG/ML IV EMUL
INTRAVENOUS | Status: AC
  Filled 2011-09-18: qty 20

## 2011-09-18 MED ORDER — ONDANSETRON HCL 4 MG/2ML IJ SOLN
INTRAMUSCULAR | Status: AC
  Filled 2011-09-18: qty 2

## 2011-09-18 MED ORDER — EPHEDRINE SULFATE 50 MG/ML IJ SOLN
INTRAMUSCULAR | Status: DC | PRN
  Administered 2011-09-18 (×2): 10 mg via INTRAVENOUS

## 2011-09-18 MED ORDER — HYDROCODONE-ACETAMINOPHEN 5-500 MG PO TABS: 1.0000 | ORAL_TABLET | ORAL | Status: DC | PRN

## 2011-09-18 MED ORDER — BUPIVACAINE-EPINEPHRINE 0.5% -1:200000 IJ SOLN
INTRAMUSCULAR | Status: DC | PRN
  Administered 2011-09-18: 10 mL

## 2011-09-18 MED ORDER — BUPIVACAINE HCL (PF) 0.5 % IJ SOLN
INTRAMUSCULAR | Status: AC
  Filled 2011-09-18: qty 30

## 2011-09-18 MED ORDER — GLYCINE 1.5 % IR SOLN
Status: DC | PRN
  Administered 2011-09-18: 3000 mL

## 2011-09-18 MED ORDER — LIDOCAINE HCL (CARDIAC) 20 MG/ML IV SOLN
INTRAVENOUS | Status: AC
  Filled 2011-09-18: qty 5

## 2011-09-18 MED ORDER — KETOROLAC TROMETHAMINE 30 MG/ML IJ SOLN
INTRAMUSCULAR | Status: AC
  Filled 2011-09-18: qty 2

## 2011-09-18 MED ORDER — BUPIVACAINE-EPINEPHRINE (PF) 0.5% -1:200000 IJ SOLN
INTRAMUSCULAR | Status: AC
  Filled 2011-09-18: qty 10

## 2011-09-18 MED ORDER — FENTANYL CITRATE 0.05 MG/ML IJ SOLN
INTRAMUSCULAR | Status: DC | PRN
  Administered 2011-09-18: 100 ug via INTRAVENOUS

## 2011-09-18 SURGICAL SUPPLY — 15 items
CANISTER SUCTION 2500CC (MISCELLANEOUS) ×2 IMPLANT
CATH ROBINSON RED A/P 16FR (CATHETERS) ×2 IMPLANT
CLOTH BEACON ORANGE TIMEOUT ST (SAFETY) ×2 IMPLANT
CONTAINER PREFILL 10% NBF 60ML (FORM) ×4 IMPLANT
ELECT REM PT RETURN 9FT ADLT (ELECTROSURGICAL) ×2
ELECTRODE REM PT RTRN 9FT ADLT (ELECTROSURGICAL) IMPLANT
GLOVE BIOGEL PI IND STRL 8.5 (GLOVE) ×1 IMPLANT
GLOVE BIOGEL PI INDICATOR 8.5 (GLOVE) ×1
GLOVE ECLIPSE 8.0 STRL XLNG CF (GLOVE) ×4 IMPLANT
GOWN PREVENTION PLUS LG XLONG (DISPOSABLE) ×2 IMPLANT
GOWN STRL REIN XL XLG (GOWN DISPOSABLE) ×2 IMPLANT
LOOP ANGLED CUTTING 22FR (CUTTING LOOP) IMPLANT
PACK HYSTEROSCOPY LF (CUSTOM PROCEDURE TRAY) ×2 IMPLANT
TOWEL OR 17X24 6PK STRL BLUE (TOWEL DISPOSABLE) ×4 IMPLANT
WATER STERILE IRR 1000ML POUR (IV SOLUTION) ×2 IMPLANT

## 2011-09-18 NOTE — H&P (Signed)
The patient was interviewed and examined today.  The previously documented history and physical examination was reviewed. There are no changes. The operative procedure was reviewed. The risks and benefits were outlined again. The specific risks include, but are not limited to, anesthetic complications, bleeding, infections, and possible damage to the surrounding organs. The patient's questions were answered.  We are ready to proceed as outlined. The likelihood of the patient achieving the goals of this procedure is very likely.   CBC    Component Value Date/Time   WBC 4.3 09/10/2011 1403   RBC 4.97 09/10/2011 1403   HGB 13.1 09/10/2011 1403   HCT 40.6 09/10/2011 1403   PLT 216 09/10/2011 1403   MCV 81.7 09/10/2011 1403   MCH 26.4 09/10/2011 1403   MCHC 32.3 09/10/2011 1403   RDW 14.7 09/10/2011 1403    Leonard Schwartz, M.D.

## 2011-09-18 NOTE — Transfer of Care (Signed)
Immediate Anesthesia Transfer of Care Note  Patient: [name]  Procedure(s) Performed:  DILATATION & CURETTAGE/HYSTEROSCOPY WITH RESECTOCOPE  Patient Location: PACU  Anesthesia Type: General  Level of Consciousness: awake and alert   Airway & Oxygen Therapy: Patient connected to nasal cannula oxygen  Post-op Assessment: Report given to PACU RN  Post vital signs: Reviewed and stable Filed Vitals:   09/18/11 0806  BP: 119/74  Pulse: 74  Temp: 36.7 C  Resp: 18    Complications: No apparent anesthesia complications

## 2011-09-18 NOTE — Anesthesia Preprocedure Evaluation (Signed)
Anesthesia Evaluation  Patient identified by MRN, date of birth, ID band Patient awake    Reviewed: Allergy & Precautions, H&P , NPO status , Patient's Chart, lab work & pertinent test results  Airway Mallampati: II TM Distance: >3 FB Neck ROM: full    Dental No notable dental hx. (+) Teeth Intact   Pulmonary neg pulmonary ROS,  clear to auscultation  Pulmonary exam normal       Cardiovascular neg cardio ROS     Neuro/Psych Negative Neurological ROS  Negative Psych ROS   GI/Hepatic Neg liver ROS, GERD-  ,  Endo/Other  Negative Endocrine ROS  Renal/GU negative Renal ROS  Genitourinary negative   Musculoskeletal   Abdominal Normal abdominal exam  (+)   Peds  Hematology negative hematology ROS (+)   Anesthesia Other Findings   Reproductive/Obstetrics                           Anesthesia Physical Anesthesia Plan  ASA: II  Anesthesia Plan: General LMA   Post-op Pain Management:    Induction:   Airway Management Planned:   Additional Equipment:   Intra-op Plan:   Post-operative Plan:   Informed Consent: I have reviewed the patients History and Physical, chart, labs and discussed the procedure including the risks, benefits and alternatives for the proposed anesthesia with the patient or authorized representative who has indicated his/her understanding and acceptance.   Dental Advisory Given  Plan Discussed with: Anesthesiologist, CRNA and Surgeon  Anesthesia Plan Comments:         Anesthesia Quick Evaluation

## 2011-09-18 NOTE — Op Note (Signed)
OPERATIVE NOTE  [name]  DOB:    1956-05-08  MRN:    [id number]  CSN:    [id number]  Date of Surgery:  09/18/2011  Preoperative Diagnosis:  Postmenopausal bleeding  Endometrial polyps  Postoperative Diagnosis:  Postmenopausal bleeding  Endometrial polyps  Procedure:  Hysteroscopy with resection of endometrial polyps  Dilatation and curettage  Surgeon:  Leonard Schwartz, M.D.  Assistant:  None  Anesthetic:  General  Disposition:  The patient is a 56 year old female who presented with postmenopausal bleeding. A hydrosonogram showed endometrial polyps. The patient understands the indications for surgical procedure and she accepts the risk of, but not limited to, anesthetic complications, bleeding, infections, and possible damage to the surrounding organs.  Findings:  The uterus is noted to be 14 week size and irregular. On hysteroscopy the patient was noted to have an endometrial polyp no other pathology was appreciated. No adnexal masses were appreciated.  Procedure:  The patient was taken to the operating room where a general anesthetic was given. The patient's perineum, and vagina were prepped with multiple layers of Betadine. The bladder was drained of urine. The patient was sterilely draped. Examination under anesthesia was performed. A paracervical block was placed using 10 cc of half percent Marcaine with epinephrine. An endocervical curettage was performed. The uterus sounded to 9 cm. The cervix was gently dilated. The diagnostic hysteroscope was inserted and the cavity was carefully inspected. The polyp was noted. Pictures were taken. The diagnostic hysteroscope was removed and the cervix was dilated further. The operative hysteroscope was inserted. A single loop was used to resect the endometrial polyp. The cavity was then curetted using a medium sharp curet until was felt to be completely clean. Hemostasis was adequate. On sutures were  removed. The uterus was reexamined and was noted to be firm. The patient was returned to the supine position. She was awakened from her anesthetic without difficulty. She was transported to the recovery room in stable condition. Sponge and needle counts were correct. The estimated blood loss was less than 10 cc. The estimated fluid deficit was 270 cc but at least 100 cc of fluid was noted on the operating room floor. The endocervical curettings, endometrial resections, and endometrial curettings were sent to pathology.  Followup instructions:  The patient return to see Dr. Stefano Gaul in 2 weeks for followup examination. She was given a prescription for Motrin, Vicodin, and Phenergan. She was given a copy of the postoperative instructions for patients who've undergone hysteroscopy.  Leonard Schwartz, M.D.

## 2011-09-18 NOTE — Anesthesia Postprocedure Evaluation (Signed)
  Anesthesia Post-op Note  Patient: [name]  Procedure(s) Performed:  DILATATION & CURETTAGE/HYSTEROSCOPY WITH RESECTOCOPE  Patient Location: PACU  Anesthesia Type: General  Level of Consciousness: awake, alert  and oriented  Airway and Oxygen Therapy: Patient Spontanous Breathing  Post-op Pain: none  Post-op Assessment: Post-op Vital signs reviewed, Patient's Cardiovascular Status Stable, Respiratory Function Stable, Patent Airway, No signs of Nausea or vomiting and Pain level controlled  Post-op Vital Signs: Reviewed and stable  Complications: No apparent anesthesia complications

## 2011-09-18 NOTE — Anesthesia Procedure Notes (Signed)
Procedure Name: LMA Insertion Date/Time: 09/18/2011 9:09 AM Performed by: Ifeoluwa Beller MARIE Pre-anesthesia Checklist: Patient identified, Timeout performed, Emergency Drugs available, Suction available and Patient being monitored Patient Re-evaluated:Patient Re-evaluated prior to inductionOxygen Delivery Method: Circle System Utilized Intubation Type: IV induction LMA Size: 4.0 Number of attempts: 1 Placement Confirmation: positive ETCO2 and breath sounds checked- equal and bilateral Dental Injury: Teeth and Oropharynx as per pre-operative assessment

## 2011-12-25 ENCOUNTER — Other Ambulatory Visit: Payer: Self-pay | Admitting: Obstetrics and Gynecology

## 2011-12-25 DIAGNOSIS — Z1231 Encounter for screening mammogram for malignant neoplasm of breast: Secondary | ICD-10-CM

## 2012-01-17 ENCOUNTER — Ambulatory Visit (HOSPITAL_COMMUNITY)
Admission: RE | Admit: 2012-01-17 | Discharge: 2012-01-17 | Disposition: A | Discharge status: Home / Self Care | Payer: BC Managed Care – PPO | Source: Ambulatory Visit | Attending: Obstetrics and Gynecology | Admitting: Obstetrics and Gynecology

## 2012-01-17 DIAGNOSIS — Z1231 Encounter for screening mammogram for malignant neoplasm of breast: Secondary | ICD-10-CM | POA: Insufficient documentation

## 2012-02-04 ENCOUNTER — Encounter: Payer: Self-pay | Admitting: Obstetrics and Gynecology

## 2012-04-05 IMAGING — MG MM DIGITAL SCREENING
1 series · 1 of 1 positions shown · non-contrast
Comparison: none

DG SCREEN MAMMOGRAM BILATERAL
Bilateral CC and MLO view(s) were taken.

DIGITAL SCREENING MAMMOGRAM WITH CAD:
The breast tissue is heterogeneously dense.  No masses or malignant type calcifications are 
identified.  Compared with prior studies.
Images were processed with CAD.

[L MLO]
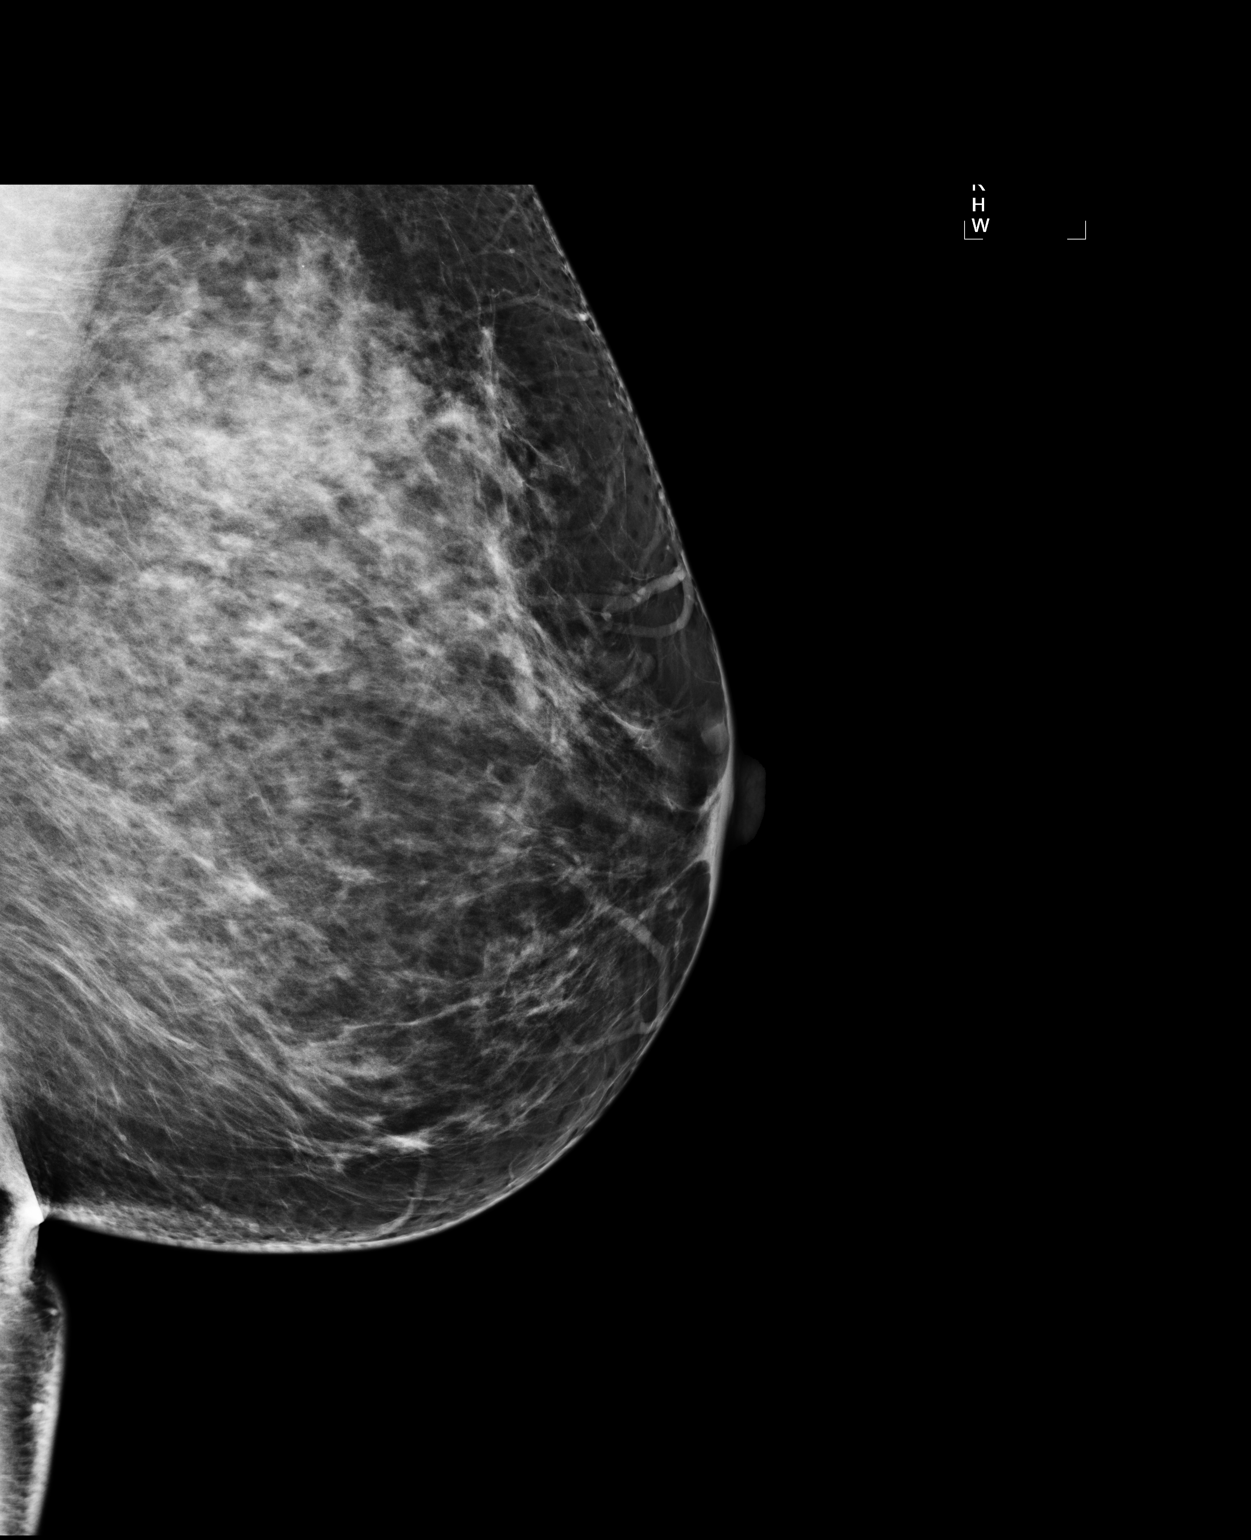

[1 of 1 positions shown; findings below may reference images not displayed]

IMPRESSION: No specific mammographic evidence of malignancy.  Next screening mammogram is recommended in one 
year.

A result letter of this screening mammogram will be mailed directly to the patient.

ASSESSMENT: Negative - BI-RADS 1

Screening mammogram in 1 year.
,

## 2013-12-24 ENCOUNTER — Emergency Department (HOSPITAL_COMMUNITY)
Admission: EM | Admit: 2013-12-24 | Discharge: 2013-12-24 | Disposition: A | Discharge status: Home / Self Care | Payer: PRIVATE HEALTH INSURANCE | Source: Home / Self Care | Attending: Emergency Medicine | Admitting: Emergency Medicine

## 2013-12-24 ENCOUNTER — Encounter (HOSPITAL_COMMUNITY): Payer: Self-pay | Admitting: Emergency Medicine

## 2013-12-24 DIAGNOSIS — J029 Acute pharyngitis, unspecified: Secondary | ICD-10-CM

## 2013-12-24 LAB — POCT RAPID STREP A: STREPTOCOCCUS, GROUP A SCREEN (DIRECT): NEGATIVE

## 2013-12-24 MED ORDER — AMOXICILLIN 500 MG PO CAPS: 1000.0000 mg | ORAL_CAPSULE | Freq: Three times a day (TID) | ORAL | Status: AC

## 2013-12-24 NOTE — Discharge Instructions (Signed)

## 2013-12-24 NOTE — ED Provider Notes (Signed)
  Chief Complaint   No chief complaint on file.   History of Present Illness   [name] is a 58 year old female who has had a two-week history of sore throat and pain on swallowing. This seems to wax and wane. She's also had some left earache, postnasal drip, and a dry cough. She denies any fever, chills, headache, stiff neck, adenopathy,. Her nasal drainage, sinus pressure, headache, wheezing, or GI symptoms. She's had no known sick exposures.   Review of Systems   Other than as noted above, the patient denies any of the following symptoms. Systemic:  No fever, chills, sweats, myalgias, or headache. Eye:  No redness, pain or drainage. ENT:  No earache, nasal congestion, sneezing, rhinorrhea, sinus pressure, sinus pain, or post nasal drip. Lungs:  No cough, sputum production, wheezing, shortness of breath, or chest pain. GI:  No abdominal pain, nausea, vomiting, or diarrhea. Skin:  No rash.  PMFSH   Past medical history, family history, social history, meds, and allergies were reviewed.   Physical Exam     Vital signs:  There were no vitals taken for this visit. General:  Alert, in no distress. Phonation was normal, no drooling, and patient was able to handle secretions well.  Eye:  No conjunctival injection or drainage. Lids were normal. ENT:  TMs and canals were normal, without erythema or inflammation.  Nasal mucosa was clear and uncongested, without drainage.  Mucous membranes were moist.  Exam of pharynx there is slight posterior pharyngeal erythema with cobblestoning, no exudate or drainage noted.  There were no oral ulcerations or lesions. There was no bulging of the tonsillar pillars, and the uvula was midline. Neck:  Supple, no adenopathy, tenderness or mass. Lungs:  No respiratory distress.  Lungs were clear to auscultation, without wheezes, rales or rhonchi.  Breath sounds were clear and equal bilaterally.  Heart:  Regular rhythm, without gallops, murmers or  rubs. Skin:  Clear, warm, and dry, without rash or lesions.  Labs   Results for orders placed during the hospital encounter of 12/24/13  POCT RAPID STREP A ([facility]RG CARE ONLY)      Result Value Ref Range   Streptococcus, Group A Screen (Direct) NEGATIVE  NEGATIVE   Assessment   The encounter diagnosis was Pharyngitis.  There is no evidence of a peritonsillar abscess.  There is not much to be seen today on physical examination. I think this is a sinus infection with postnasal drip.  Plan     1.  Meds:  The following meds were prescribed:   Discharge Medication List as of 12/24/2013  5:34 PM    START taking these medications   Details  amoxicillin (AMOXIL) 500 MG capsule Take 2 capsules (1,000 mg total) by mouth 3 (three) times daily., Starting 12/24/2013, Until Discontinued, Normal        2.  Patient Education/Counseling:  The patient was given appropriate handouts, self care instructions, and instructed in symptomatic relief, including hot saline gargles, throat lozenges, infectious precautions, and need to trade out toothbrush.    3.  Follow up:  The patient was told to follow up here if no better in 3 to 4 days, or sooner if becoming worse in any way, and given some red flag symptoms such as difficulty swallowing or breathing which would prompt immediate return.       Reuben Likesavid C Bronco Mcgrory, MD 12/24/13 [phone]

## 2013-12-24 NOTE — ED Notes (Signed)
C/o sore throat on/off x 2 wks. Ear ache - Both x this am. Cough-dry  Denies: fever, sob, chest pain

## 2013-12-26 LAB — CULTURE, GROUP A STREP

## 2023-12-10 ENCOUNTER — Ambulatory Visit (INDEPENDENT_AMBULATORY_CARE_PROVIDER_SITE_OTHER): Payer: PRIVATE HEALTH INSURANCE | Admitting: Family Medicine

## 2023-12-10 ENCOUNTER — Encounter: Payer: Self-pay | Admitting: Family Medicine

## 2023-12-10 VITALS — BP 163/84 | HR 63 | Ht 67.0 in | Wt 211.8 lb

## 2023-12-10 DIAGNOSIS — Z0000 Encounter for general adult medical examination without abnormal findings: Secondary | ICD-10-CM | POA: Diagnosis not present

## 2023-12-10 DIAGNOSIS — Z1159 Encounter for screening for other viral diseases: Secondary | ICD-10-CM

## 2023-12-10 DIAGNOSIS — Z7689 Persons encountering health services in other specified circumstances: Secondary | ICD-10-CM

## 2023-12-10 DIAGNOSIS — Z13820 Encounter for screening for osteoporosis: Secondary | ICD-10-CM

## 2023-12-10 DIAGNOSIS — Z780 Asymptomatic menopausal state: Secondary | ICD-10-CM

## 2023-12-10 DIAGNOSIS — Z13220 Encounter for screening for lipoid disorders: Secondary | ICD-10-CM | POA: Diagnosis not present

## 2023-12-10 DIAGNOSIS — Z114 Encounter for screening for human immunodeficiency virus [HIV]: Secondary | ICD-10-CM

## 2023-12-10 DIAGNOSIS — Z13228 Encounter for screening for other metabolic disorders: Secondary | ICD-10-CM

## 2023-12-10 DIAGNOSIS — Z130 Encounter for screening for diseases of the blood and blood-forming organs and certain disorders involving the immune mechanism: Secondary | ICD-10-CM | POA: Diagnosis not present

## 2023-12-10 DIAGNOSIS — Z1329 Encounter for screening for other suspected endocrine disorder: Secondary | ICD-10-CM

## 2023-12-10 DIAGNOSIS — Z1211 Encounter for screening for malignant neoplasm of colon: Secondary | ICD-10-CM

## 2023-12-10 DIAGNOSIS — Z1231 Encounter for screening mammogram for malignant neoplasm of breast: Secondary | ICD-10-CM

## 2023-12-12 ENCOUNTER — Encounter: Payer: Self-pay | Admitting: Family Medicine

## 2023-12-12 LAB — LIPID PANEL
Chol/HDL Ratio: 3.2 ratio (ref 0.0–4.4)
Cholesterol, Total: 252 mg/dL — ABNORMAL HIGH (ref 100–199)
HDL: 79 mg/dL (ref >39)
LDL Chol Calc (NIH): 163 mg/dL — ABNORMAL HIGH (ref 0–99)
Triglycerides: 63 mg/dL (ref 0–149)
VLDL Cholesterol Cal: 10 mg/dL (ref 5–40)

## 2023-12-12 LAB — CMP14+EGFR
ALT: 15 IU/L (ref 0–32)
AST: 20 IU/L (ref 0–40)
Albumin: 4.7 g/dL (ref 3.9–4.9)
Alkaline Phosphatase: 74 IU/L (ref 44–121)
BUN/Creatinine Ratio: 14 (ref 12–28)
BUN: 10 mg/dL (ref 8–27)
Bilirubin Total: <0.2 mg/dL (ref 0.0–1.2)
CO2: 18 mmol/L — ABNORMAL LOW (ref 20–29)
Calcium: 9.4 mg/dL (ref 8.7–10.3)
Chloride: 105 mmol/L (ref 96–106)
Creatinine, Ser: 0.69 mg/dL (ref 0.57–1.00)
Globulin, Total: 2.6 g/dL (ref 1.5–4.5)
Glucose: 62 mg/dL — ABNORMAL LOW (ref 70–99)
Potassium: 4.8 mmol/L (ref 3.5–5.2)
Sodium: 141 mmol/L (ref 134–144)
Total Protein: 7.3 g/dL (ref 6.0–8.5)
eGFR: 96 mL/min/1.73

## 2023-12-12 LAB — CBC WITH DIFFERENTIAL/PLATELET
Basophils Absolute: 0 10*3/uL (ref 0.0–0.2)
Basos: 1 %
EOS (ABSOLUTE): 0 10*3/uL (ref 0.0–0.4)
Eos: 1 %
Hematocrit: 42.6 % (ref 34.0–46.6)
Hemoglobin: 13.6 g/dL (ref 11.1–15.9)
Immature Grans (Abs): 0 10*3/uL (ref 0.0–0.1)
Immature Granulocytes: 0 %
Lymphocytes Absolute: 1.2 10*3/uL (ref 0.7–3.1)
Lymphs: 32 %
MCH: 26.4 pg — ABNORMAL LOW (ref 26.6–33.0)
MCHC: 31.9 g/dL (ref 31.5–35.7)
MCV: 83 fL (ref 79–97)
Monocytes Absolute: 0.4 10*3/uL (ref 0.1–0.9)
Monocytes: 10 %
Neutrophils Absolute: 2.2 10*3/uL (ref 1.4–7.0)
Neutrophils: 56 %
Platelets: 226 10*3/uL (ref 150–450)
RBC: 5.16 x10E6/uL (ref 3.77–5.28)
RDW: 13.7 % (ref 11.7–15.4)
WBC: 3.8 10*3/uL (ref 3.4–10.8)

## 2023-12-12 LAB — HGB A1C W/O EAG: Hgb A1c MFr Bld: 6 % — ABNORMAL HIGH (ref 4.8–5.6)

## 2023-12-12 LAB — HEPATITIS C ANTIBODY: Hep C Virus Ab: NONREACTIVE

## 2023-12-12 NOTE — Progress Notes (Signed)
 New Patient Office Visit  Subjective    Patient ID: [name], female    DOB: 1956/03/27  Age: 68 y.o. MRN: [id number]  CC:  Chief Complaint  Patient presents with   New Patient (Initial Visit)    Patient stated that she having pain in hip (right side) and leg no numbness or tingling      HPI [name] presents to establish care and for routine annual exam. Patient denies acute complaints.    Outpatient Encounter Medications as of 12/10/2023  Medication Sig   cholecalciferol (VITAMIN D3) 25 MCG (1000 UNIT) tablet Take by mouth.   Multiple Vitamin (MULITIVITAMIN WITH MINERALS) TABS Take 1 tablet by mouth daily.     amoxicillin (AMOXIL) 500 MG capsule Take 2 capsules (1,000 mg total) by mouth 3 (three) times daily. (Patient not taking: Reported on 12/10/2023)   No facility-administered encounter medications on file as of 12/10/2023.    Past Medical History:  Diagnosis Date   Anemia    GERD (gastroesophageal reflux disease)    no meds    Past Surgical History:  Procedure Laterality Date   DILATION AND CURETTAGE OF UTERUS     MYOMECTOMY      No family history on file.  Social History   Socioeconomic History   Marital status: Single    Spouse name: Not on file   Number of children: Not on file   Years of education: Not on file   Highest education level: Not on file  Occupational History   Not on file  Tobacco Use   Smoking status: Never   Smokeless tobacco: Never  Vaping Use   Vaping status: Never Used  Substance and Sexual Activity   Alcohol use: Yes    Comment: occasionally   Drug use: No   Sexual activity: Not Currently  Other Topics Concern   Not on file  Social History Narrative   Not on file   Social Drivers of Health   Financial Resource Strain: Not on file  Food Insecurity: Not on file  Transportation Needs: Not on file  Physical Activity: Not on file  Stress: Not on file  Social Connections: Not on file  Intimate  Partner Violence: Not on file    Review of Systems  All other systems reviewed and are negative.       Objective   BP (!) 163/84 (BP Location: Right Arm, Patient Position: Sitting)   Pulse 63   Ht 5\' 7"  (1.702 m)   Wt 211 lb 12.8 oz (96.1 kg)   SpO2 (!) 89%   BMI 33.17 kg/m   Physical Exam Vitals and nursing note reviewed.  Constitutional:      General: She is not in acute distress. HENT:     Head: Normocephalic and atraumatic.     Right Ear: Tympanic membrane, ear canal and external ear normal.     Left Ear: Tympanic membrane, ear canal and external ear normal.     Nose: Nose normal.     Mouth/Throat:     Mouth: Mucous membranes are moist.     Pharynx: Oropharynx is clear.  Eyes:     Conjunctiva/sclera: Conjunctivae normal.     Pupils: Pupils are equal, round, and reactive to light.  Neck:     Thyroid: No thyromegaly.  Cardiovascular:     Rate and Rhythm: Normal rate and regular rhythm.     Heart sounds: Normal heart sounds. No murmur heard. Pulmonary:     Effort:  Pulmonary effort is normal. No respiratory distress.     Breath sounds: Normal breath sounds.  Abdominal:     General: There is no distension.     Palpations: Abdomen is soft. There is no mass.     Tenderness: There is no abdominal tenderness.  Musculoskeletal:        General: Normal range of motion.     Cervical back: Normal range of motion and neck supple.  Skin:    General: Skin is warm and dry.  Neurological:     General: No focal deficit present.     Mental Status: She is alert and oriented to person, place, and time.  Psychiatric:        Mood and Affect: Mood normal.        Behavior: Behavior normal.         Assessment & Plan:   Annual physical exam -     CMP14+EGFR  Encounter to establish care  Screening for deficiency anemia -     CBC with Differential/Platelet  Screening for lipid disorders -     Lipid panel  Encounter for screening mammogram for malignant neoplasm of  breast -     3D Screening Mammogram, Left and Right; Future  Screening for colon cancer -     Cologuard  Encounter for osteoporosis screening in asymptomatic postmenopausal patient -     DG Bone Density; Future  Screening for HIV (human immunodeficiency virus)  Need for hepatitis C screening test -     Hepatitis C antibody  Other orders -     Hgb A1c w/o eAG     Return in about 4 weeks (around 01/07/2024) for follow up.   Tommie Raymond, MD

## 2023-12-26 ENCOUNTER — Ambulatory Visit
Admission: RE | Admit: 2023-12-26 | Discharge: 2023-12-26 | Disposition: A | Discharge status: Home / Self Care | Payer: PRIVATE HEALTH INSURANCE | Source: Ambulatory Visit | Attending: Family Medicine | Admitting: Family Medicine

## 2023-12-26 DIAGNOSIS — Z1231 Encounter for screening mammogram for malignant neoplasm of breast: Secondary | ICD-10-CM

## 2023-12-29 LAB — COLOGUARD: COLOGUARD: NEGATIVE

## 2024-01-07 ENCOUNTER — Ambulatory Visit (INDEPENDENT_AMBULATORY_CARE_PROVIDER_SITE_OTHER): Payer: PRIVATE HEALTH INSURANCE | Admitting: Family Medicine

## 2024-01-07 ENCOUNTER — Encounter: Payer: Self-pay | Admitting: Family Medicine

## 2024-01-07 VITALS — BP 116/74 | HR 73 | Wt 210.2 lb

## 2024-01-07 DIAGNOSIS — M25551 Pain in right hip: Secondary | ICD-10-CM

## 2024-01-07 NOTE — Progress Notes (Unsigned)
 Established Patient Office Visit  Subjective    Patient ID: [name], female    DOB: [birth year]  Age: 68 y.o. MRN: [id number]  CC:  Chief Complaint  Patient presents with   Medical Management of Chronic Issues   Hip Pain    HPI [name] presents for complaint of persistent right hip pain. Patient denies known trauma or injury but reports sx started a couple of years ago when she had issues with her left lower extremity.   Outpatient Encounter Medications as of 01/07/2024  Medication Sig   cholecalciferol (VITAMIN D3) 25 MCG (1000 UNIT) tablet Take by mouth.   Multiple Vitamin (MULITIVITAMIN WITH MINERALS) TABS Take 1 tablet by mouth daily.     amoxicillin  (AMOXIL ) 500 MG capsule Take 2 capsules (1,000 mg total) by mouth 3 (three) times daily. (Patient not taking: Reported on 01/07/2024)   No facility-administered encounter medications on file as of 01/07/2024.    Past Medical History:  Diagnosis Date   Anemia    GERD (gastroesophageal reflux disease)    no meds    Past Surgical History:  Procedure Laterality Date   DILATION AND CURETTAGE OF UTERUS     MYOMECTOMY      History reviewed. No pertinent family history.  Social History   Socioeconomic History   Marital status: Single    Spouse name: Not on file   Number of children: Not on file   Years of education: Not on file   Highest education level: Not on file  Occupational History   Not on file  Tobacco Use   Smoking status: Never   Smokeless tobacco: Never  Vaping Use   Vaping status: Never Used  Substance and Sexual Activity   Alcohol use: Yes    Comment: occasionally   Drug use: No   Sexual activity: Not Currently  Other Topics Concern   Not on file  Social History Narrative   Not on file   Social Drivers of Health   Financial Resource Strain: Not on file  Food Insecurity: Not on file  Transportation Needs: Not on file  Physical Activity: Not on file  Stress: Not on file   Social Connections: Not on file  Intimate Partner Violence: Not on file    Review of Systems  All other systems reviewed and are negative.       Objective    BP 116/74 (BP Location: Right Arm, Patient Position: Sitting, Cuff Size: Normal)   Pulse 73   Wt 210 lb 3.2 oz (95.3 kg)   SpO2 96%   BMI 32.92 kg/m   Physical Exam Vitals and nursing note reviewed.  Constitutional:      General: She is not in acute distress. Cardiovascular:     Rate and Rhythm: Normal rate and regular rhythm.  Pulmonary:     Effort: Pulmonary effort is normal.     Breath sounds: Normal breath sounds.  Musculoskeletal:     Right hip: Tenderness present. Decreased range of motion.  Neurological:     General: No focal deficit present.     Mental Status: She is alert and oriented to person, place, and time.         Assessment & Plan:   Right hip pain -     DG HIP UNILAT W OR W/O PELVIS 2-3 VIEWS RIGHT; Future   Consider PT or ortho pending xray results.   Return if symptoms worsen or fail to improve.   Arlo Lama, MD

## 2024-01-08 ENCOUNTER — Encounter: Payer: Self-pay | Admitting: Family Medicine

## 2024-01-08 DIAGNOSIS — M25551 Pain in right hip: Secondary | ICD-10-CM

## 2024-01-09 ENCOUNTER — Ambulatory Visit (INDEPENDENT_AMBULATORY_CARE_PROVIDER_SITE_OTHER)

## 2024-01-09 DIAGNOSIS — M25551 Pain in right hip: Secondary | ICD-10-CM

## 2024-01-21 ENCOUNTER — Ambulatory Visit: Payer: Self-pay | Admitting: Family Medicine

## 2024-01-22 ENCOUNTER — Other Ambulatory Visit: Payer: Self-pay | Admitting: Family Medicine

## 2024-01-22 DIAGNOSIS — M25551 Pain in right hip: Secondary | ICD-10-CM

## 2024-02-03 ENCOUNTER — Ambulatory Visit (INDEPENDENT_AMBULATORY_CARE_PROVIDER_SITE_OTHER): Admitting: Orthopaedic Surgery

## 2024-02-03 ENCOUNTER — Encounter: Payer: Self-pay | Admitting: Orthopaedic Surgery

## 2024-02-03 DIAGNOSIS — M1611 Unilateral primary osteoarthritis, right hip: Secondary | ICD-10-CM

## 2024-02-03 MED ORDER — CELECOXIB 200 MG PO CAPS: 200.0000 mg | ORAL_CAPSULE | Freq: Two times a day (BID) | ORAL | 3 refills | Status: AC

## 2024-02-03 NOTE — Progress Notes (Signed)
 Office Visit Note   Patient: [name]           Date of Birth: 1956/03/26           MRN: [id number] Visit Date: 02/03/2024              Requested by: Abraham Abo, MD [address] [place],  [place] [address] PCP: Abraham Abo, MD   Assessment & Plan: Visit Diagnoses:  1. Primary osteoarthritis of right hip     Plan: History of Present Illness Serah R Sirianni is a 68 year old female who presents with worsening right hip pain.  She experiences pain radiating down her leg and occasionally in the groin, affecting her sleep. The pain is rated as six out of ten during certain movements. She has difficulty lifting her leg to tie her shoes or put on socks. She has not tried steroid injections but found oral medication ineffective. Despite the pain, she remains active, playing pickleball and going to the gym, though compensating for the hip pain sometimes leads to fatigue on the opposite side. Her mother underwent hip replacement surgery in her eighties.  Physical Exam MUSCULOSKELETAL: Right hip bursa non-tender. Pain present deep inside the right hip with leg rotation and flexion. No pain with resisted right hip flexion.  Results RADIOLOGY Hip X-ray: Severe bilateral degenerative arthritis basically bone on bone (01/2024)  Assessment and Plan Degenerative arthritis of hip Degenerative arthritis in both hips, significant in right. X-rays show bone-on-bone contact. Chronic, progressive condition likely requiring future hip replacements. Current focus on symptom control and activity maintenance. - Prescribed Celebrex for pain management. - Referred to physical therapy for flexibility and strength. - Discussed cortisone injection if medications and therapy insufficient.  Follow-Up Instructions: No follow-ups on file.   Orders:  Orders Placed This Encounter  Procedures   Ambulatory referral to Physical Therapy   Meds ordered this encounter   Medications   celecoxib (CELEBREX) 200 MG capsule    Sig: Take 1 capsule (200 mg total) by mouth 2 (two) times daily.    Dispense:  30 capsule    Refill:  3     Subjective: Chief Complaint  Patient presents with   Right Hip - Pain    Points to right buttock/ ischial area states pain at night     HPI  Review of Systems  Constitutional: Negative.   HENT: Negative.    Eyes: Negative.   Respiratory: Negative.    Cardiovascular: Negative.   Endocrine: Negative.   Musculoskeletal: Negative.   Neurological: Negative.   Hematological: Negative.   Psychiatric/Behavioral: Negative.    All other systems reviewed and are negative.    Objective: Vital Signs: There were no vitals taken for this visit.  Physical Exam Vitals and nursing note reviewed.  Constitutional:      Appearance: She is well-developed.  HENT:     Head: Atraumatic.     Nose: Nose normal.  Eyes:     Extraocular Movements: Extraocular movements intact.  Cardiovascular:     Pulses: Normal pulses.  Pulmonary:     Effort: Pulmonary effort is normal.  Abdominal:     Palpations: Abdomen is soft.  Musculoskeletal:     Cervical back: Neck supple.  Skin:    General: Skin is warm.     Capillary Refill: Capillary refill takes less than 2 seconds.  Neurological:     Mental Status: She is alert. Mental status is at baseline.  Psychiatric:  Behavior: Behavior normal.        Thought Content: Thought content normal.        Judgment: Judgment normal.     PMFS History: There are no active problems to display for this patient.  Past Medical History:  Diagnosis Date   Anemia    GERD (gastroesophageal reflux disease)    no meds    History reviewed. No pertinent family history.  Past Surgical History:  Procedure Laterality Date   DILATION AND CURETTAGE OF UTERUS     MYOMECTOMY     Social History   Occupational History   Not on file  Tobacco Use   Smoking status: Never   Smokeless tobacco:  Never  Vaping Use   Vaping status: Never Used  Substance and Sexual Activity   Alcohol use: Yes    Comment: occasionally   Drug use: No   Sexual activity: Not Currently

## 2024-04-01 ENCOUNTER — Other Ambulatory Visit: Payer: Self-pay

## 2024-04-01 ENCOUNTER — Encounter: Payer: Self-pay | Admitting: Physical Therapy

## 2024-04-01 ENCOUNTER — Ambulatory Visit: Attending: Orthopaedic Surgery | Admitting: Physical Therapy

## 2024-04-01 DIAGNOSIS — R2689 Other abnormalities of gait and mobility: Secondary | ICD-10-CM | POA: Insufficient documentation

## 2024-04-01 DIAGNOSIS — M6281 Muscle weakness (generalized): Secondary | ICD-10-CM | POA: Diagnosis present

## 2024-04-01 DIAGNOSIS — M25651 Stiffness of right hip, not elsewhere classified: Secondary | ICD-10-CM | POA: Insufficient documentation

## 2024-04-01 DIAGNOSIS — M25551 Pain in right hip: Secondary | ICD-10-CM | POA: Diagnosis present

## 2024-04-01 DIAGNOSIS — M1611 Unilateral primary osteoarthritis, right hip: Secondary | ICD-10-CM | POA: Insufficient documentation

## 2024-04-01 NOTE — Therapy (Signed)
 OUTPATIENT PHYSICAL THERAPY LOWER EXTREMITY EVALUATION   Patient Name: [name] MRN: [id number] DOB:[birth year], 68 y.o., female Today's Date: 04/01/2024  END OF SESSION:  PT End of Session - 04/01/24 1822     Visit Number 1    Number of Visits 13    Date for PT Re-Evaluation 05/13/24    PT Start Time 1745    PT Stop Time 1823    PT Time Calculation (min) 38 min          Past Medical History:  Diagnosis Date   Anemia    GERD (gastroesophageal reflux disease)    no meds   Past Surgical History:  Procedure Laterality Date   DILATION AND CURETTAGE OF UTERUS     MYOMECTOMY     There are no active problems to display for this patient.   PCP: [name]  REFERRING PROVIDER: Jerri Kay HERO, MD  REFERRING DIAG: [phone] (ICD-10-CM) - Primary osteoarthritis of right hip   Rationale for Evaluation and Treatment: Rehabilitation  THERAPY DIAG:  Muscle weakness (generalized) - Plan: PT plan of care cert/re-cert  Stiffness of right hip, not elsewhere classified - Plan: PT plan of care cert/re-cert  Pain in right hip - Plan: PT plan of care cert/re-cert  Other abnormalities of gait and mobility - Plan: PT plan of care cert/re-cert  PERTINENT HISTORY: No pertinent PM  WEIGHT BEARING RESTRICTIONS: No  FALLS:  Has patient fallen in last 6 months? Yes. Number of falls fell while playing pickleball, tripped when rotating  LIVING ENVIRONMENT: Lives with: lives alone Lives in: House/apartment Stairs: no issues going up or down stairs most days. Has following equipment at home: None  OCCUPATION: semi-retired caregiver, recently.   PRECAUTIONS: None ---------------------------------------------------------------------------------------------  SUBJECTIVE:   SUBJECTIVE STATEMENT: Eval statement 04/01/2024: originally injured LLE, once out of the boot and noticed increased R hip pain. Noticed it the most around November of last year, began trying to go  to the gym and exercising, noticed some benfit however wants to continue strengthening. Most issue with putting shoes on in a figure 4 position and mowing grass. Elevation of RLE helps.surgeon already recommended a R hip replacement  Pain levels are 3/10 currently, no n/t   RED FLAGS: None   PLOF: Independent  PATIENT GOALS: get more mobility   NEXT MD VISIT: cannot recall, maybe November. ---------------------------------------------------------------------------------------------  OBJECTIVE:  Note: Objective measures were completed at Evaluation unless otherwise noted.  DIAGNOSTIC FINDINGS: MPRESSION: Moderate right and moderate to severe left femoroacetabular osteoarthritis.     Electronically Signed   By: [name]   On: 01/17/2024 17:57  PATIENT SURVEYS:  LEFS  COGNITION: Overall cognitive status: Within functional limits for tasks assessed     SENSATION: WFL  EDEMA:  No swelling.   MUSCLE LENGTH: Hamstrings: Right 15 deg; Left 15 deg Thomas test: Right 20 deg; Left 15 deg  POSTURE: No Significant postural limitations  PALPATION: Tenderness around acetabular ridge  LOWER EXTREMITY ROM:  Active ROM Right eval Left eval  Hip flexion 110d 120d  Hip extension [facility]  Hip abduction    Hip adduction    Hip internal rotation 10d! [facility]  Hip external rotation 30d! [facility]  Knee flexion    Knee extension    Ankle dorsiflexion    Ankle plantarflexion    Ankle inversion    Ankle eversion     (Blank rows = not tested)  ! Indicates pain with testing  LOWER EXTREMITY MMT:  MMT Right  eval Left eval  Hip flexion 4+ 4+  Hip extension 4 4  Hip abduction    Hip adduction    Hip internal rotation    Hip external rotation    Knee flexion 5 5  Knee extension 4 4-  Ankle dorsiflexion    Ankle plantarflexion    Ankle inversion    Ankle eversion     (Blank rows = not tested)  ! Indicates pain with testing  LOWER EXTREMITY SPECIAL TESTS:  Hip  special tests: Belvie (FABER) test: positive  and Hip scouring test: positive   GAIT: Distance walked: 118ft Assistive device utilized: None Level of assistance: Complete Independence Comments: flexed trunk, R pelvic oblqiuity during gait, R lateral lean.                                                                                                                                [facility] Adult PT Treatment:                                                DATE: 04/01/2024 Self Care: Pt education, detailed below POC discussion    PATIENT EDUCATION:  Education details: Pt received education regarding HEP performance, ADL performance, functional activity tolerance, impairment education, appropriate performance of therapeutic activities. Person educated: Patient Education method: Explanation, Demonstration, Tactile cues, Verbal cues, and Handouts Education comprehension: verbalized understanding and returned demonstration  HOME EXERCISE PROGRAM: Access Code: [email or web address] URL: [email or web address] Date: 04/01/2024 Prepared by: Mabel Kiang  Exercises - Sidelying Hip Abduction  - 1 x daily - 4 x weekly - 2-3 sets - 10 reps - 3s hold - Sidelying Hip Adduction  - 1 x daily - 4 x weekly - 2-3 sets - 10 reps - 3s hold - Supine Bridge with Resistance Band  - 1 x daily - 7 x weekly - 2-3 sets - 12 reps - 5s hold - Seated Hip Internal Rotation AROM  - 1-3 x daily - 7 x weekly - 2 sets - 15 reps - 2s hold - Seated Hip External Rotation AROM  - 1-3 x daily - 7 x weekly - 3 sets - 15 reps - 2s hold ---------------------------------------------------------------------------------------------  ASSESSMENT:  CLINICAL IMPRESSION: Eval impression (04/01/2024): Pt. attended today's physical therapy session for evaluation of R hip pain. Pt has complaints of 2/10 pain onset since last November, that gets worse after prolonged sitting or with general activity, especially first thing in the  morning. Pt has notable deficits with R hip mobility, strength, gait pattern, and imagine reports of mod - severe B hip oa.Treatment performed today focused on pt education detailed in the objective. Pt demonstrated great understanding of education provided. required minimal v/t cues and no assistance for appropriate performance with today's activities. Pt requires the intervention of skilled outpatient physical therapy to  address the aforementioned deficits and progress towards a functional level in line with therapeutic goals.   OBJECTIVE IMPAIRMENTS: Abnormal gait, decreased mobility, difficulty walking, decreased ROM, decreased strength, hypomobility, improper body mechanics, and pain.   ACTIVITY LIMITATIONS: standing, squatting, dressing, and locomotion level  PARTICIPATION LIMITATIONS: community activity, occupation, and yard work  PERSONAL FACTORS: Age, Fitness, and Time since onset of injury/illness/exacerbation are also affecting patient's functional outcome.   REHAB POTENTIAL: Fair severe OA  CLINICAL DECISION MAKING: Stable/uncomplicated  EVALUATION COMPLEXITY: Low   GOALS: Goals reviewed with patient? YES  SHORT TERM GOALS: Target date: 04/22/2024 Pt will be independent with administered HEP to demonstrate the competency necessary for long term managemnet of symptoms at home.  Baseline: Goal status: INITIAL  LONG TERM GOALS: Target date: 05/13/2024  Pt. Will achieve a LEFS score of 68/80 as to demonstrate improvement in self-perceived functional ability with daily activities.  Baseline: 59/80 Goal status: INITIAL  2.  Pt will improve Global Hip/knee strength to a 4+/5 to demonstrate improvement in strength for quality of motion and activity performance.  Baseline:  Goal status: INITIAL  3.   Pt will independently ambulate 20 minutes with no AD and less than 2/10 pain alongside a normalized gait pattern to demonstrate improved weightbearing tolerance, BLE strength, and  functional capacity for community ambulation. Baseline:  Goal status: INITIAL  4.  .Pt will report being able to participate in Pickleball to demonstrate return to PLOF with recreational activity Baseline: hasn't returned Goal status: INITIAL  --------------------------------------------------------------------------------------------- PLAN:  PT FREQUENCY: 1-2x/week  PT DURATION: 6 weeks  PLANNED INTERVENTIONS: 97110-Therapeutic exercises, 97530- Therapeutic activity, 97112- Neuromuscular re-education, 97535- Self Care, [id number]- Manual therapy, [phone]- Gait training, [phone]- Aquatic Therapy, Patient/Family education, Balance training, Stair training, Taping, and Joint mobilization  PLAN FOR NEXT SESSION: Review HEP, Begin POC as detailed in the assessment   Mabel Kiang, PT, DPT 04/01/2024, 6:29 PM    Date of referral: 02/03/2024 Referring provider: Jerri Kay HERO, MD Referring diagnosis? Primary osteoarthritis of right hip [M16.11]  Treatment diagnosis? (if different than referring diagnosis) Hip weakness, R hip pain, gait deficits  What was this (referring dx) caused by? Arthritis  Nature of Condition: Chronic (continuous duration > 3 months)   Laterality: Rt  Current Functional Measure Score: LEFS 59/80  Objective measurements identify impairments when they are compared to normal values, the uninvolved extremity, and prior level of function.  [x]  Yes  []  No  Objective assessment of functional ability: Minimal functional limitations   Briefly describe symptoms: R hip pain since november  How did symptoms start: no moi  Average pain intensity:  Last 24 hours: 2/10  Past week: 4/10  How often does the pt experience symptoms? Constantly  How much have the symptoms interfered with usual daily activities? Moderately  How has condition changed since care began at this facility? NA - initial visit  In general, how is the patients overall health? Good   BACK PAIN  (STarT Back Screening Tool) No

## 2024-04-15 ENCOUNTER — Ambulatory Visit: Attending: Orthopaedic Surgery | Admitting: Physical Therapy

## 2024-04-15 DIAGNOSIS — R2689 Other abnormalities of gait and mobility: Secondary | ICD-10-CM | POA: Diagnosis present

## 2024-04-15 DIAGNOSIS — M6281 Muscle weakness (generalized): Secondary | ICD-10-CM | POA: Insufficient documentation

## 2024-04-15 DIAGNOSIS — M25651 Stiffness of right hip, not elsewhere classified: Secondary | ICD-10-CM | POA: Insufficient documentation

## 2024-04-15 DIAGNOSIS — M25551 Pain in right hip: Secondary | ICD-10-CM | POA: Insufficient documentation

## 2024-04-15 NOTE — Therapy (Signed)
 OUTPATIENT PHYSICAL THERAPY LOWER EXTREMITY EVALUATION   Patient Name: [name] MRN: [id number] DOB:1956/06/27, 68 y.o., female Today's Date: 04/15/2024  END OF SESSION:  PT End of Session - 04/15/24 1610     Visit Number 2    Number of Visits 13    Date for PT Re-Evaluation 05/13/24    PT Start Time 1530    PT Stop Time 1610    PT Time Calculation (min) 40 min    Activity Tolerance Patient tolerated treatment well    Behavior During Therapy WFL for tasks assessed/performed           Past Medical History:  Diagnosis Date   Anemia    GERD (gastroesophageal reflux disease)    no meds   Past Surgical History:  Procedure Laterality Date   DILATION AND CURETTAGE OF UTERUS     MYOMECTOMY     There are no active problems to display for this patient.   PCP: [name]  REFERRING PROVIDER: Jerri Kay HERO, MD  REFERRING DIAG: [phone] (ICD-10-CM) - Primary osteoarthritis of right hip   Rationale for Evaluation and Treatment: Rehabilitation  THERAPY DIAG:  Muscle weakness (generalized)  Stiffness of right hip, not elsewhere classified  Pain in right hip  Other abnormalities of gait and mobility  PERTINENT HISTORY: No pertinent PM  WEIGHT BEARING RESTRICTIONS: No  FALLS:  Has patient fallen in last 6 months? Yes. Number of falls fell while playing pickleball, tripped when rotating  LIVING ENVIRONMENT: Lives with: lives alone Lives in: House/apartment Stairs: no issues going up or down stairs most days. Has following equipment at home: None  OCCUPATION: semi-retired caregiver, recently.   PRECAUTIONS: None ---------------------------------------------------------------------------------------------  SUBJECTIVE:   SUBJECTIVE STATEMENT: Eval statement 04/01/2024: originally injured LLE, once out of the boot and noticed increased R hip pain. Noticed it the most around November of last year, began trying to go to the gym and exercising,  noticed some benfit however wants to continue strengthening. Most issue with putting shoes on in a figure 4 position and mowing grass. Elevation of RLE helps.surgeon already recommended a R hip replacement  Pain levels are 3/10 currently, no n/t   RED FLAGS: None   PLOF: Independent  PATIENT GOALS: get more mobility   NEXT MD VISIT: cannot recall, maybe November. ---------------------------------------------------------------------------------------------  OBJECTIVE:  Note: Objective measures were completed at Evaluation unless otherwise noted.  DIAGNOSTIC FINDINGS: MPRESSION: Moderate right and moderate to severe left femoroacetabular osteoarthritis.     Electronically Signed   By: [name]   On: 01/17/2024 17:57  PATIENT SURVEYS:  LEFS  COGNITION: Overall cognitive status: Within functional limits for tasks assessed     SENSATION: WFL  EDEMA:  No swelling.   MUSCLE LENGTH: Hamstrings: Right 15 deg; Left 15 deg Thomas test: Right 20 deg; Left 15 deg  POSTURE: No Significant postural limitations  PALPATION: Tenderness around acetabular ridge  LOWER EXTREMITY ROM:  Active ROM Right eval Left eval  Hip flexion 110d 120d  Hip extension [facility]  Hip abduction    Hip adduction    Hip internal rotation 10d! [facility]  Hip external rotation 30d! [facility]  Knee flexion    Knee extension    Ankle dorsiflexion    Ankle plantarflexion    Ankle inversion    Ankle eversion     (Blank rows = not tested)  ! Indicates pain with testing  LOWER EXTREMITY MMT:  MMT Right eval Left eval  Hip flexion 4+ 4+  Hip extension 4 4  Hip abduction    Hip adduction    Hip internal rotation    Hip external rotation    Knee flexion 5 5  Knee extension 4 4-  Ankle dorsiflexion    Ankle plantarflexion    Ankle inversion    Ankle eversion     (Blank rows = not tested)  ! Indicates pain with testing  LOWER EXTREMITY SPECIAL TESTS:  Hip special tests: Belvie (FABER)  test: positive  and Hip scouring test: positive   GAIT: Distance walked: 130ft Assistive device utilized: None Level of assistance: Complete Independence Comments: flexed trunk, R pelvic oblqiuity during gait, R lateral lean.   [facility] Adult PT Treatment:                                                DATE: 04/15/2024  Therapeutic Exercise: NuStep 8' lvl 6 Supine hip flexor stretch w/ contralateral KTC,  2x1'B , 5# cuff weight on stretching leg Gr III-V distraction of R hip  Therapeutic Activity: Side lying clamshells 2x12 RLE, hold 2s, RTB Side lying reverse clamshells 2x12 RLE, hold 2s Standing marches with resistance 2x12, hold 2s, RTB                                                                                                                         [facility] Adult PT Treatment:                                                DATE: 04/01/2024 Self Care: Pt education, detailed below POC discussion    PATIENT EDUCATION:  Education details: Pt received education regarding HEP performance, ADL performance, functional activity tolerance, impairment education, appropriate performance of therapeutic activities. Person educated: Patient Education method: Explanation, Demonstration, Tactile cues, Verbal cues, and Handouts Education comprehension: verbalized understanding and returned demonstration  HOME EXERCISE PROGRAM: Access Code: [email or web address] URL: [email or web address] Date: 04/01/2024 Prepared by: Mabel Kiang  Exercises - Sidelying Hip Abduction  - 1 x daily - 4 x weekly - 2-3 sets - 10 reps - 3s hold - Sidelying Hip Adduction  - 1 x daily - 4 x weekly - 2-3 sets - 10 reps - 3s hold - Supine Bridge with Resistance Band  - 1 x daily - 7 x weekly - 2-3 sets - 12 reps - 5s hold - Seated Hip Internal Rotation AROM  - 1-3 x daily - 7 x weekly - 2 sets - 15 reps - 2s hold - Seated Hip External Rotation AROM  - 1-3 x daily - 7 x weekly - 3 sets - 15 reps - 2s  hold ---------------------------------------------------------------------------------------------  ASSESSMENT:  CLINICAL IMPRESSION: Pt attended physical therapy session for continuation  of treatment regarding R hip pain. Today's treatment focused on improvement of  B hip motility/mobility and proximal hip strengthening. Pt attended with reports of today being a good day so pain Is minimal. Pt showed great tolerance to administered treatment with no adverse effects by the end of session. Skilled intervention was utilized via activity modification for pt tolerance with task completion, functional progression/regression promoting best outcomes inline with current rehab goals, as well as minimal verbal/tactile cuing alongside no physical assistance for safe and appropriate performance of today's activities. Continue to progress as tolerated within current POC fucus.   Eval impression (04/01/2024): Pt. attended today's physical therapy session for evaluation of R hip pain. Pt has complaints of 2/10 pain onset since last November, that gets worse after prolonged sitting or with general activity, especially first thing in the morning. Pt has notable deficits with R hip mobility, strength, gait pattern, and imagine reports of mod - severe B hip oa.Treatment performed today focused on pt education detailed in the objective. Pt demonstrated great understanding of education provided. required minimal v/t cues and no assistance for appropriate performance with today's activities. Pt requires the intervention of skilled outpatient physical therapy to address the aforementioned deficits and progress towards a functional level in line with therapeutic goals.   OBJECTIVE IMPAIRMENTS: Abnormal gait, decreased mobility, difficulty walking, decreased ROM, decreased strength, hypomobility, improper body mechanics, and pain.   ACTIVITY LIMITATIONS: standing, squatting, dressing, and locomotion level  PARTICIPATION  LIMITATIONS: community activity, occupation, and yard work  PERSONAL FACTORS: Age, Fitness, and Time since onset of injury/illness/exacerbation are also affecting patient's functional outcome.   REHAB POTENTIAL: Fair severe OA  CLINICAL DECISION MAKING: Stable/uncomplicated  EVALUATION COMPLEXITY: Low   GOALS: Goals reviewed with patient? YES  SHORT TERM GOALS: Target date: 04/22/2024 Pt will be independent with administered HEP to demonstrate the competency necessary for long term managemnet of symptoms at home.  Baseline: Goal status: INITIAL  LONG TERM GOALS: Target date: 05/13/2024  Pt. Will achieve a LEFS score of 68/80 as to demonstrate improvement in self-perceived functional ability with daily activities.  Baseline: 59/80 Goal status: INITIAL  2.  Pt will improve Global Hip/knee strength to a 4+/5 to demonstrate improvement in strength for quality of motion and activity performance.  Baseline:  Goal status: INITIAL  3.   Pt will independently ambulate 20 minutes with no AD and less than 2/10 pain alongside a normalized gait pattern to demonstrate improved weightbearing tolerance, BLE strength, and functional capacity for community ambulation. Baseline:  Goal status: INITIAL  4.  .Pt will report being able to participate in Pickleball to demonstrate return to PLOF with recreational activity Baseline: hasn't returned Goal status: INITIAL  --------------------------------------------------------------------------------------------- PLAN:  PT FREQUENCY: 1-2x/week  PT DURATION: 6 weeks  PLANNED INTERVENTIONS: 97110-Therapeutic exercises, 97530- Therapeutic activity, 97112- Neuromuscular re-education, 97535- Self Care, [id number]- Manual therapy, [phone]- Gait training, [phone]- Aquatic Therapy, Patient/Family education, Balance training, Stair training, Taping, and Joint mobilization  PLAN FOR NEXT SESSION: Review HEP, Begin POC as detailed in the assessment   Mabel Kiang, PT, DPT 04/15/2024, 4:11 PM    Date of referral: 02/03/2024 Referring provider: Jerri Kay HERO, MD Referring diagnosis? Primary osteoarthritis of right hip [M16.11]  Treatment diagnosis? (if different than referring diagnosis) Hip weakness, R hip pain, gait deficits  What was this (referring dx) caused by? Arthritis  Nature of Condition: Chronic (continuous duration > 3 months)   Laterality: Rt  Current Functional Measure Score: LEFS 59/80  Objective measurements  identify impairments when they are compared to normal values, the uninvolved extremity, and prior level of function.  [x]  Yes  []  No  Objective assessment of functional ability: Minimal functional limitations   Briefly describe symptoms: R hip pain since november  How did symptoms start: no moi  Average pain intensity:  Last 24 hours: 2/10  Past week: 4/10  How often does the pt experience symptoms? Constantly  How much have the symptoms interfered with usual daily activities? Moderately  How has condition changed since care began at this facility? NA - initial visit  In general, how is the patients overall health? Good   BACK PAIN (STarT Back Screening Tool) No

## 2024-04-15 NOTE — Therapy (Unsigned)
 OUTPATIENT PHYSICAL THERAPY LOWER EXTREMITY EVALUATION   Patient Name: [name] MRN: [id number] DOB:Mar 13, 1956, 68 y.o., female Today's Date: 04/15/2024  END OF SESSION:    Past Medical History:  Diagnosis Date   Anemia    GERD (gastroesophageal reflux disease)    no meds   Past Surgical History:  Procedure Laterality Date   DILATION AND CURETTAGE OF UTERUS     MYOMECTOMY     There are no active problems to display for this patient.   PCP: [name]  REFERRING PROVIDER: Jerri Kay HERO, MD  REFERRING DIAG: [phone] (ICD-10-CM) - Primary osteoarthritis of right hip   Rationale for Evaluation and Treatment: Rehabilitation  THERAPY DIAG:  No diagnosis found.  PERTINENT HISTORY: No pertinent PM  WEIGHT BEARING RESTRICTIONS: No  FALLS:  Has patient fallen in last 6 months? Yes. Number of falls fell while playing pickleball, tripped when rotating  LIVING ENVIRONMENT: Lives with: lives alone Lives in: House/apartment Stairs: no issues going up or down stairs most days. Has following equipment at home: None  OCCUPATION: semi-retired caregiver, recently.   PRECAUTIONS: None ---------------------------------------------------------------------------------------------  SUBJECTIVE:   SUBJECTIVE STATEMENT: Eval statement 04/01/2024: originally injured LLE, once out of the boot and noticed increased R hip pain. Noticed it the most around November of last year, began trying to go to the gym and exercising, noticed some benfit however wants to continue strengthening. Most issue with putting shoes on in a figure 4 position and mowing grass. Elevation of RLE helps.surgeon already recommended a R hip replacement  Pain levels are 3/10 currently, no n/t   RED FLAGS: None   PLOF: Independent  PATIENT GOALS: get more mobility   NEXT MD VISIT: cannot recall, maybe  November. ---------------------------------------------------------------------------------------------  OBJECTIVE:  Note: Objective measures were completed at Evaluation unless otherwise noted.  DIAGNOSTIC FINDINGS: MPRESSION: Moderate right and moderate to severe left femoroacetabular osteoarthritis.     Electronically Signed   By: [name]   On: 01/17/2024 17:57  PATIENT SURVEYS:  LEFS  COGNITION: Overall cognitive status: Within functional limits for tasks assessed     SENSATION: WFL  EDEMA:  No swelling.   MUSCLE LENGTH: Hamstrings: Right 15 deg; Left 15 deg Thomas test: Right 20 deg; Left 15 deg  POSTURE: No Significant postural limitations  PALPATION: Tenderness around acetabular ridge  LOWER EXTREMITY ROM:  Active ROM Right eval Left eval  Hip flexion 110d 120d  Hip extension [facility]  Hip abduction    Hip adduction    Hip internal rotation 10d! [facility]  Hip external rotation 30d! [facility]  Knee flexion    Knee extension    Ankle dorsiflexion    Ankle plantarflexion    Ankle inversion    Ankle eversion     (Blank rows = not tested)  ! Indicates pain with testing  LOWER EXTREMITY MMT:  MMT Right eval Left eval  Hip flexion 4+ 4+  Hip extension 4 4  Hip abduction    Hip adduction    Hip internal rotation    Hip external rotation    Knee flexion 5 5  Knee extension 4 4-  Ankle dorsiflexion    Ankle plantarflexion    Ankle inversion    Ankle eversion     (Blank rows = not tested)  ! Indicates pain with testing  LOWER EXTREMITY SPECIAL TESTS:  Hip special tests: Belvie (FABER) test: positive  and Hip scouring test: positive   GAIT: Distance walked: 113ft Assistive device utilized: None  Level of assistance: Complete Independence Comments: flexed trunk, R pelvic oblqiuity during gait, R lateral lean.                                                                                                                                 [facility] Adult PT Treatment:                                                DATE: 04/01/2024 Self Care: Pt education, detailed below POC discussion    PATIENT EDUCATION:  Education details: Pt received education regarding HEP performance, ADL performance, functional activity tolerance, impairment education, appropriate performance of therapeutic activities. Person educated: Patient Education method: Explanation, Demonstration, Tactile cues, Verbal cues, and Handouts Education comprehension: verbalized understanding and returned demonstration  HOME EXERCISE PROGRAM: Access Code: [email or web address] URL: [email or web address] Date: 04/01/2024 Prepared by: Mabel Kiang  Exercises - Sidelying Hip Abduction  - 1 x daily - 4 x weekly - 2-3 sets - 10 reps - 3s hold - Sidelying Hip Adduction  - 1 x daily - 4 x weekly - 2-3 sets - 10 reps - 3s hold - Supine Bridge with Resistance Band  - 1 x daily - 7 x weekly - 2-3 sets - 12 reps - 5s hold - Seated Hip Internal Rotation AROM  - 1-3 x daily - 7 x weekly - 2 sets - 15 reps - 2s hold - Seated Hip External Rotation AROM  - 1-3 x daily - 7 x weekly - 3 sets - 15 reps - 2s hold ---------------------------------------------------------------------------------------------  ASSESSMENT:  CLINICAL IMPRESSION: Eval impression (04/01/2024): Pt. attended today's physical therapy session for evaluation of R hip pain. Pt has complaints of 2/10 pain onset since last November, that gets worse after prolonged sitting or with general activity, especially first thing in the morning. Pt has notable deficits with R hip mobility, strength, gait pattern, and imagine reports of mod - severe B hip oa.Treatment performed today focused on pt education detailed in the objective. Pt demonstrated great understanding of education provided. required minimal v/t cues and no assistance for appropriate performance with today's activities. Pt requires the intervention of skilled  outpatient physical therapy to address the aforementioned deficits and progress towards a functional level in line with therapeutic goals.   OBJECTIVE IMPAIRMENTS: Abnormal gait, decreased mobility, difficulty walking, decreased ROM, decreased strength, hypomobility, improper body mechanics, and pain.   ACTIVITY LIMITATIONS: standing, squatting, dressing, and locomotion level  PARTICIPATION LIMITATIONS: community activity, occupation, and yard work  PERSONAL FACTORS: Age, Fitness, and Time since onset of injury/illness/exacerbation are also affecting patient's functional outcome.   REHAB POTENTIAL: Fair severe OA  CLINICAL DECISION MAKING: Stable/uncomplicated  EVALUATION COMPLEXITY: Low   GOALS: Goals reviewed with patient? YES  SHORT TERM GOALS: Target date: 04/22/2024 Pt will be independent with  administered HEP to demonstrate the competency necessary for long term managemnet of symptoms at home.  Baseline: Goal status: INITIAL  LONG TERM GOALS: Target date: 05/13/2024  Pt. Will achieve a LEFS score of 68/80 as to demonstrate improvement in self-perceived functional ability with daily activities.  Baseline: 59/80 Goal status: INITIAL  2.  Pt will improve Global Hip/knee strength to a 4+/5 to demonstrate improvement in strength for quality of motion and activity performance.  Baseline:  Goal status: INITIAL  3.   Pt will independently ambulate 20 minutes with no AD and less than 2/10 pain alongside a normalized gait pattern to demonstrate improved weightbearing tolerance, BLE strength, and functional capacity for community ambulation. Baseline:  Goal status: INITIAL  4.  .Pt will report being able to participate in Pickleball to demonstrate return to PLOF with recreational activity Baseline: hasn't returned Goal status: INITIAL  --------------------------------------------------------------------------------------------- PLAN:  PT FREQUENCY: 1-2x/week  PT DURATION: 6  weeks  PLANNED INTERVENTIONS: 97110-Therapeutic exercises, 97530- Therapeutic activity, 97112- Neuromuscular re-education, 97535- Self Care, [id number]- Manual therapy, [phone]- Gait training, [phone]- Aquatic Therapy, Patient/Family education, Balance training, Stair training, Taping, and Joint mobilization  PLAN FOR NEXT SESSION: Review HEP, Begin POC as detailed in the assessment   Mabel Kiang, PT, DPT 04/15/2024, 3:08 PM    Date of referral: 02/03/2024 Referring provider: Jerri Kay HERO, MD Referring diagnosis? Primary osteoarthritis of right hip [M16.11]  Treatment diagnosis? (if different than referring diagnosis) Hip weakness, R hip pain, gait deficits  What was this (referring dx) caused by? Arthritis  Nature of Condition: Chronic (continuous duration > 3 months)   Laterality: Rt  Current Functional Measure Score: LEFS 59/80  Objective measurements identify impairments when they are compared to normal values, the uninvolved extremity, and prior level of function.  [x]  Yes  []  No  Objective assessment of functional ability: Minimal functional limitations   Briefly describe symptoms: R hip pain since november  How did symptoms start: no moi  Average pain intensity:  Last 24 hours: 2/10  Past week: 4/10  How often does the pt experience symptoms? Constantly  How much have the symptoms interfered with usual daily activities? Moderately  How has condition changed since care began at this facility? NA - initial visit  In general, how is the patients overall health? Good   BACK PAIN (STarT Back Screening Tool) No

## 2024-04-19 ENCOUNTER — Ambulatory Visit

## 2024-04-19 DIAGNOSIS — M25551 Pain in right hip: Secondary | ICD-10-CM

## 2024-04-19 DIAGNOSIS — M6281 Muscle weakness (generalized): Secondary | ICD-10-CM | POA: Diagnosis not present

## 2024-04-19 DIAGNOSIS — M25651 Stiffness of right hip, not elsewhere classified: Secondary | ICD-10-CM

## 2024-04-19 NOTE — Therapy (Signed)
 OUTPATIENT PHYSICAL THERAPY TREATMENT NOTE   Patient Name: [name] MRN: [id number] DOB:09/21/1955, 68 y.o., female Today's Date: 04/19/2024  END OF SESSION:  PT End of Session - 04/19/24 1351     Visit Number 3    Number of Visits 13    Date for PT Re-Evaluation 05/13/24    PT Start Time 1400    PT Stop Time 1440    PT Time Calculation (min) 40 min    Activity Tolerance Patient tolerated treatment well    Behavior During Therapy WFL for tasks assessed/performed            Past Medical History:  Diagnosis Date   Anemia    GERD (gastroesophageal reflux disease)    no meds   Past Surgical History:  Procedure Laterality Date   DILATION AND CURETTAGE OF UTERUS     MYOMECTOMY     There are no active problems to display for this patient.   PCP: [name]  REFERRING PROVIDER: Jerri Kay HERO, MD  REFERRING DIAG: [phone] (ICD-10-CM) - Primary osteoarthritis of right hip   Rationale for Evaluation and Treatment: Rehabilitation  THERAPY DIAG:  Muscle weakness (generalized)  Stiffness of right hip, not elsewhere classified  Pain in right hip  PERTINENT HISTORY: No pertinent PM  WEIGHT BEARING RESTRICTIONS: No  FALLS:  Has patient fallen in last 6 months? Yes. Number of falls fell while playing pickleball, tripped when rotating  LIVING ENVIRONMENT: Lives with: lives alone Lives in: House/apartment Stairs: no issues going up or down stairs most days. Has following equipment at home: None  OCCUPATION: semi-retired caregiver, recently.   PRECAUTIONS: None ---------------------------------------------------------------------------------------------  SUBJECTIVE:   SUBJECTIVE STATEMENT: Arrives with R hip soreness, minimal.  Symptoms aggravated by walking and prolonged standing.  Pain levels are 3/10 currently, no n/t   RED FLAGS: None   PLOF: Independent  PATIENT GOALS: get more mobility   NEXT MD VISIT: cannot recall, maybe  November. ---------------------------------------------------------------------------------------------  OBJECTIVE:  Note: Objective measures were completed at Evaluation unless otherwise noted.  DIAGNOSTIC FINDINGS: MPRESSION: Moderate right and moderate to severe left femoroacetabular osteoarthritis.     Electronically Signed   By: [name]   On: 01/17/2024 17:57  PATIENT SURVEYS:  LEFS  COGNITION: Overall cognitive status: Within functional limits for tasks assessed     SENSATION: WFL  EDEMA:  No swelling.   MUSCLE LENGTH: Hamstrings: Right 15 deg; Left 15 deg Thomas test: Right 20 deg; Left 15 deg  POSTURE: No Significant postural limitations  PALPATION: Tenderness around acetabular ridge  LOWER EXTREMITY ROM:  Active ROM Right eval Left eval  Hip flexion 110d 120d  Hip extension [facility]  Hip abduction    Hip adduction    Hip internal rotation 10d! [facility]  Hip external rotation 30d! [facility]  Knee flexion    Knee extension    Ankle dorsiflexion    Ankle plantarflexion    Ankle inversion    Ankle eversion     (Blank rows = not tested)  ! Indicates pain with testing  LOWER EXTREMITY MMT:  MMT Right eval Left eval  Hip flexion 4+ 4+  Hip extension 4 4  Hip abduction    Hip adduction    Hip internal rotation    Hip external rotation    Knee flexion 5 5  Knee extension 4 4-  Ankle dorsiflexion    Ankle plantarflexion    Ankle inversion    Ankle eversion     (Blank  rows = not tested)  ! Indicates pain with testing  LOWER EXTREMITY SPECIAL TESTS:  Hip special tests: Belvie (FABER) test: positive  and Hip scouring test: positive   GAIT: Distance walked: 125ft Assistive device utilized: None Level of assistance: Complete Independence Comments: flexed trunk, R pelvic oblqiuity during gait, R lateral lean.  [facility] Adult PT Treatment:                                                DATE: 04/19/24 Therapeutic Exercise: Nustep L4 8  min Neuromuscular re-ed: Supine hip fallouts RTB 15x B, 15/15 unilaterally S/L clams RTB 15/15 Bridge against RTB 15x Bridge with ball 15x Therapeutic Activity: R hip flexor stretch 60s x2 STS from Airex pad 10x arms crossed  RLE LLD 60s   [facility] Adult PT Treatment:                                                DATE: 04/15/2024  Therapeutic Exercise: NuStep 8' lvl 6 Supine hip flexor stretch w/ contralateral KTC,  2x1'B , 5# cuff weight on stretching leg Gr III-V distraction of R hip  Therapeutic Activity: Side lying clamshells 2x12 RLE, hold 2s, RTB Side lying reverse clamshells 2x12 RLE, hold 2s Standing marches with resistance 2x12, hold 2s, RTB                                                                                                                         [facility] Adult PT Treatment:                                                DATE: 04/01/2024 Self Care: Pt education, detailed below POC discussion    PATIENT EDUCATION:  Education details: Pt received education regarding HEP performance, ADL performance, functional activity tolerance, impairment education, appropriate performance of therapeutic activities. Person educated: Patient Education method: Explanation, Demonstration, Tactile cues, Verbal cues, and Handouts Education comprehension: verbalized understanding and returned demonstration  HOME EXERCISE PROGRAM: Access Code: [email or web address] URL: [email or web address] Date: 04/01/2024 Prepared by: Mabel Kiang  Exercises - Sidelying Hip Abduction  - 1 x daily - 4 x weekly - 2-3 sets - 10 reps - 3s hold - Sidelying Hip Adduction  - 1 x daily - 4 x weekly - 2-3 sets - 10 reps - 3s hold - Supine Bridge with Resistance Band  - 1 x daily - 7 x weekly - 2-3 sets - 12 reps - 5s hold - Seated Hip Internal Rotation AROM  - 1-3 x daily - 7 x weekly - 2 sets - 15  reps - 2s hold - Seated Hip External Rotation AROM  - 1-3 x daily - 7 x weekly - 3 sets - 15  reps - 2s hold ---------------------------------------------------------------------------------------------  ASSESSMENT:  CLINICAL IMPRESSION: Today's session focused on hip strength and stability as well.  Added additional strength and stabilizing tasks.    Eval impression (04/01/2024): Pt. attended today's physical therapy session for evaluation of R hip pain. Pt has complaints of 2/10 pain onset since last November, that gets worse after prolonged sitting or with general activity, especially first thing in the morning. Pt has notable deficits with R hip mobility, strength, gait pattern, and imagine reports of mod - severe B hip oa.Treatment performed today focused on pt education detailed in the objective. Pt demonstrated great understanding of education provided. required minimal v/t cues and no assistance for appropriate performance with today's activities. Pt requires the intervention of skilled outpatient physical therapy to address the aforementioned deficits and progress towards a functional level in line with therapeutic goals.   OBJECTIVE IMPAIRMENTS: Abnormal gait, decreased mobility, difficulty walking, decreased ROM, decreased strength, hypomobility, improper body mechanics, and pain.   ACTIVITY LIMITATIONS: standing, squatting, dressing, and locomotion level  PARTICIPATION LIMITATIONS: community activity, occupation, and yard work  PERSONAL FACTORS: Age, Fitness, and Time since onset of injury/illness/exacerbation are also affecting patient's functional outcome.   REHAB POTENTIAL: Fair severe OA  CLINICAL DECISION MAKING: Stable/uncomplicated  EVALUATION COMPLEXITY: Low   GOALS: Goals reviewed with patient? YES  SHORT TERM GOALS: Target date: 04/22/2024 Pt will be independent with administered HEP to demonstrate the competency necessary for long term managemnet of symptoms at home.  Baseline: Goal status: INITIAL  LONG TERM GOALS: Target date: 05/13/2024  Pt. Will  achieve a LEFS score of 68/80 as to demonstrate improvement in self-perceived functional ability with daily activities.  Baseline: 59/80 Goal status: INITIAL  2.  Pt will improve Global Hip/knee strength to a 4+/5 to demonstrate improvement in strength for quality of motion and activity performance.  Baseline:  Goal status: INITIAL  3.   Pt will independently ambulate 20 minutes with no AD and less than 2/10 pain alongside a normalized gait pattern to demonstrate improved weightbearing tolerance, BLE strength, and functional capacity for community ambulation. Baseline:  Goal status: INITIAL  4.  .Pt will report being able to participate in Pickleball to demonstrate return to PLOF with recreational activity Baseline: hasn't returned Goal status: INITIAL  --------------------------------------------------------------------------------------------- PLAN:  PT FREQUENCY: 1-2x/week  PT DURATION: 6 weeks  PLANNED INTERVENTIONS: 97110-Therapeutic exercises, 97530- Therapeutic activity, 97112- Neuromuscular re-education, 97535- Self Care, [id number]- Manual therapy, [phone]- Gait training, [phone]- Aquatic Therapy, Patient/Family education, Balance training, Stair training, Taping, and Joint mobilization  PLAN FOR NEXT SESSION: Review HEP, Begin POC as detailed in the assessment   Mabel Kiang, PT, DPT 04/19/2024, 2:43 PM    Date of referral: 02/03/2024 Referring provider: Jerri Kay HERO, MD Referring diagnosis? Primary osteoarthritis of right hip [M16.11]  Treatment diagnosis? (if different than referring diagnosis) Hip weakness, R hip pain, gait deficits  What was this (referring dx) caused by? Arthritis  Nature of Condition: Chronic (continuous duration > 3 months)   Laterality: Rt  Current Functional Measure Score: LEFS 59/80  Objective measurements identify impairments when they are compared to normal values, the uninvolved extremity, and prior level of function.  [x]  Yes  []   No  Objective assessment of functional ability: Minimal functional limitations   Briefly describe symptoms: R hip pain since november  How did symptoms start:  no moi  Average pain intensity:  Last 24 hours: 2/10  Past week: 4/10  How often does the pt experience symptoms? Constantly  How much have the symptoms interfered with usual daily activities? Moderately  How has condition changed since care began at this facility? NA - initial visit  In general, how is the patients overall health? Good   BACK PAIN (STarT Back Screening Tool) No

## 2024-04-20 NOTE — Therapy (Unsigned)
 OUTPATIENT PHYSICAL THERAPY TREATMENT NOTE   Patient Name: [name] MRN: [id number] DOB:1955/11/20, 68 y.o., female Today's Date: 04/22/2024  END OF SESSION:  PT End of Session - 04/22/24 1530     Visit Number 4    Number of Visits 13    Date for PT Re-Evaluation 05/13/24    Authorization Time Period Submitted for 12 visits starting 04/01/24    PT Start Time 1530    PT Stop Time 1610    PT Time Calculation (min) 40 min    Activity Tolerance Patient tolerated treatment well    Behavior During Therapy WFL for tasks assessed/performed             Past Medical History:  Diagnosis Date   Anemia    GERD (gastroesophageal reflux disease)    no meds   Past Surgical History:  Procedure Laterality Date   DILATION AND CURETTAGE OF UTERUS     MYOMECTOMY     There are no active problems to display for this patient.   PCP: [name]  REFERRING PROVIDER: Jerri Kay HERO, MD  REFERRING DIAG: [phone] (ICD-10-CM) - Primary osteoarthritis of right hip   Rationale for Evaluation and Treatment: Rehabilitation  THERAPY DIAG:  Muscle weakness (generalized)  Stiffness of right hip, not elsewhere classified  Pain in right hip  PERTINENT HISTORY: No pertinent PM  WEIGHT BEARING RESTRICTIONS: No  FALLS:  Has patient fallen in last 6 months? Yes. Number of falls fell while playing pickleball, tripped when rotating  LIVING ENVIRONMENT: Lives with: lives alone Lives in: House/apartment Stairs: no issues going up or down stairs most days. Has following equipment at home: None  OCCUPATION: semi-retired caregiver, recently.   PRECAUTIONS: None ---------------------------------------------------------------------------------------------  SUBJECTIVE:   SUBJECTIVE STATEMENT: Pain less overall. Finds putting a pillow b/t her knees relieves hip pain.  Pain levels are 3/10 currently, no n/t   RED FLAGS: None   PLOF: Independent  PATIENT GOALS: get  more mobility   NEXT MD VISIT: cannot recall, maybe November. ---------------------------------------------------------------------------------------------  OBJECTIVE:  Note: Objective measures were completed at Evaluation unless otherwise noted.  DIAGNOSTIC FINDINGS: MPRESSION: Moderate right and moderate to severe left femoroacetabular osteoarthritis.     Electronically Signed   By: [name]   On: 01/17/2024 17:57  PATIENT SURVEYS:  LEFS  COGNITION: Overall cognitive status: Within functional limits for tasks assessed     SENSATION: WFL  EDEMA:  No swelling.   MUSCLE LENGTH: Hamstrings: Right 15 deg; Left 15 deg Thomas test: Right 20 deg; Left 15 deg  POSTURE: No Significant postural limitations  PALPATION: Tenderness around acetabular ridge  LOWER EXTREMITY ROM:  Active ROM Right eval Left eval  Hip flexion 110d 120d  Hip extension [facility]  Hip abduction    Hip adduction    Hip internal rotation 10d! [facility]  Hip external rotation 30d! [facility]  Knee flexion    Knee extension    Ankle dorsiflexion    Ankle plantarflexion    Ankle inversion    Ankle eversion     (Blank rows = not tested)  ! Indicates pain with testing  LOWER EXTREMITY MMT:  MMT Right eval Left eval  Hip flexion 4+ 4+  Hip extension 4 4  Hip abduction    Hip adduction    Hip internal rotation    Hip external rotation    Knee flexion 5 5  Knee extension 4 4-  Ankle dorsiflexion    Ankle plantarflexion  Ankle inversion    Ankle eversion     (Blank rows = not tested)  ! Indicates pain with testing  LOWER EXTREMITY SPECIAL TESTS:  Hip special tests: Belvie (FABER) test: positive  and Hip scouring test: positive   GAIT: Distance walked: 154ft Assistive device utilized: None Level of assistance: Complete Independence Comments: flexed trunk, R pelvic oblqiuity during gait, R lateral lean.  [facility] Adult PT Treatment:                                                DATE:  04/22/24 Therapeutic Exercise: Nustep L4 8 min Neuromuscular re-ed: Supine hip fallouts GTB 15x B, 15/15 unilaterally S/L clams GTB 15/15 Bridge against GTB 15x Bridge with ball 15x Therapeutic Activity: R hip flexor stretch 60s x2 STS from Airex pad 10x arms crossed OH press with ball RLE LLD 60s  [facility] Adult PT Treatment:                                                DATE: 04/19/24 Therapeutic Exercise: Nustep L4 8 min Neuromuscular re-ed: Supine hip fallouts RTB 15x B, 15/15 unilaterally S/L clams RTB 15/15 Bridge against RTB 15x Bridge with ball 15x Therapeutic Activity: R hip flexor stretch 60s x2 STS from Airex pad 10x arms crossed  RLE LLD 60s   [facility] Adult PT Treatment:                                                DATE: 04/15/2024  Therapeutic Exercise: NuStep 8' lvl 6 Supine hip flexor stretch w/ contralateral KTC,  2x1'B , 5# cuff weight on stretching leg Gr III-V distraction of R hip  Therapeutic Activity: Side lying clamshells 2x12 RLE, hold 2s, RTB Side lying reverse clamshells 2x12 RLE, hold 2s Standing marches with resistance 2x12, hold 2s, RTB                                                                                                                         [facility] Adult PT Treatment:                                                DATE: 04/01/2024 Self Care: Pt education, detailed below POC discussion    PATIENT EDUCATION:  Education details: Pt received education regarding HEP performance, ADL performance, functional activity tolerance, impairment education, appropriate performance of therapeutic activities. Person educated: Patient Education method: Explanation, Demonstration, Tactile cues,  Verbal cues, and Handouts Education comprehension: verbalized understanding and returned demonstration  HOME EXERCISE PROGRAM: Access Code: [email or web address] URL: [email or web address] Date: 04/01/2024 Prepared by: Mabel Kiang  Exercises -  Sidelying Hip Abduction  - 1 x daily - 4 x weekly - 2-3 sets - 10 reps - 3s hold - Sidelying Hip Adduction  - 1 x daily - 4 x weekly - 2-3 sets - 10 reps - 3s hold - Supine Bridge with Resistance Band  - 1 x daily - 7 x weekly - 2-3 sets - 12 reps - 5s hold - Seated Hip Internal Rotation AROM  - 1-3 x daily - 7 x weekly - 2 sets - 15 reps - 2s hold - Seated Hip External Rotation AROM  - 1-3 x daily - 7 x weekly - 3 sets - 15 reps - 2s hold ---------------------------------------------------------------------------------------------  ASSESSMENT:  CLINICAL IMPRESSION: Symptoms improving.  Discussed strategies to return to [facility].  Increased resistance and difficulty as noted.  Able to tolerate all tasks w/o any increase in symptoms   Eval impression (04/01/2024): Pt. attended today's physical therapy session for evaluation of R hip pain. Pt has complaints of 2/10 pain onset since last November, that gets worse after prolonged sitting or with general activity, especially first thing in the morning. Pt has notable deficits with R hip mobility, strength, gait pattern, and imagine reports of mod - severe B hip oa.Treatment performed today focused on pt education detailed in the objective. Pt demonstrated great understanding of education provided. required minimal v/t cues and no assistance for appropriate performance with today's activities. Pt requires the intervention of skilled outpatient physical therapy to address the aforementioned deficits and progress towards a functional level in line with therapeutic goals.   OBJECTIVE IMPAIRMENTS: Abnormal gait, decreased mobility, difficulty walking, decreased ROM, decreased strength, hypomobility, improper body mechanics, and pain.   ACTIVITY LIMITATIONS: standing, squatting, dressing, and locomotion level  PARTICIPATION LIMITATIONS: community activity, occupation, and yard work  PERSONAL FACTORS: Age, Fitness, and Time since onset of  injury/illness/exacerbation are also affecting patient's functional outcome.   REHAB POTENTIAL: Fair severe OA  CLINICAL DECISION MAKING: Stable/uncomplicated  EVALUATION COMPLEXITY: Low   GOALS: Goals reviewed with patient? YES  SHORT TERM GOALS: Target date: 04/22/2024 Pt will be independent with administered HEP to demonstrate the competency necessary for long term managemnet of symptoms at home.  Baseline: Goal status: Met  LONG TERM GOALS: Target date: 05/13/2024  Pt. Will achieve a LEFS score of 68/80 as to demonstrate improvement in self-perceived functional ability with daily activities.  Baseline: 59/80 Goal status: INITIAL  2.  Pt will improve Global Hip/knee strength to a 4+/5 to demonstrate improvement in strength for quality of motion and activity performance.  Baseline:  Goal status: INITIAL  3.   Pt will independently ambulate 20 minutes with no AD and less than 2/10 pain alongside a normalized gait pattern to demonstrate improved weightbearing tolerance, BLE strength, and functional capacity for community ambulation. Baseline:  Goal status: INITIAL  4.  .Pt will report being able to participate in Pickleball to demonstrate return to PLOF with recreational activity Baseline: hasn't returned Goal status: INITIAL  --------------------------------------------------------------------------------------------- PLAN:  PT FREQUENCY: 1-2x/week  PT DURATION: 6 weeks  PLANNED INTERVENTIONS: 97110-Therapeutic exercises, 97530- Therapeutic activity, 97112- Neuromuscular re-education, 97535- Self Care, 02859- Manual therapy, [phone]- Gait training, [phone]- Aquatic Therapy, Patient/Family education, Balance training, Stair training, Taping, and Joint mobilization  PLAN FOR NEXT SESSION: Review HEP, Begin POC as detailed in the assessment  Mabel Kiang, PT, DPT 04/22/2024, 3:34 PM    Date of referral: 02/03/2024 Referring provider: Jerri Kay HERO, MD Referring  diagnosis? Primary osteoarthritis of right hip [M16.11]  Treatment diagnosis? (if different than referring diagnosis) Hip weakness, R hip pain, gait deficits  What was this (referring dx) caused by? Arthritis  Nature of Condition: Chronic (continuous duration > 3 months)   Laterality: Rt  Current Functional Measure Score: LEFS 59/80  Objective measurements identify impairments when they are compared to normal values, the uninvolved extremity, and prior level of function.  [x]  Yes  []  No  Objective assessment of functional ability: Minimal functional limitations   Briefly describe symptoms: R hip pain since november  How did symptoms start: no moi  Average pain intensity:  Last 24 hours: 2/10  Past week: 4/10  How often does the pt experience symptoms? Constantly  How much have the symptoms interfered with usual daily activities? Moderately  How has condition changed since care began at this facility? NA - initial visit  In general, how is the patients overall health? Good   BACK PAIN (STarT Back Screening Tool) No

## 2024-04-22 ENCOUNTER — Ambulatory Visit

## 2024-04-22 DIAGNOSIS — M6281 Muscle weakness (generalized): Secondary | ICD-10-CM | POA: Diagnosis not present

## 2024-04-22 DIAGNOSIS — M25651 Stiffness of right hip, not elsewhere classified: Secondary | ICD-10-CM

## 2024-04-22 DIAGNOSIS — M25551 Pain in right hip: Secondary | ICD-10-CM

## 2024-04-26 ENCOUNTER — Ambulatory Visit

## 2024-04-26 DIAGNOSIS — M25551 Pain in right hip: Secondary | ICD-10-CM

## 2024-04-26 DIAGNOSIS — M6281 Muscle weakness (generalized): Secondary | ICD-10-CM | POA: Diagnosis not present

## 2024-04-26 DIAGNOSIS — M25651 Stiffness of right hip, not elsewhere classified: Secondary | ICD-10-CM

## 2024-04-26 NOTE — Therapy (Signed)
 OUTPATIENT PHYSICAL THERAPY TREATMENT NOTE   Patient Name: [name] MRN: [id number] DOB:Jan 02, 1956, 68 y.o., female Today's Date: 04/26/2024  END OF SESSION:  PT End of Session - 04/26/24 1537     Visit Number 5    Number of Visits 13    Date for PT Re-Evaluation 05/13/24    Authorization Time Period Submitted for 12 visits starting 04/01/24    PT Start Time 1535    PT Stop Time 1615    PT Time Calculation (min) 40 min    Activity Tolerance Patient tolerated treatment well    Behavior During Therapy WFL for tasks assessed/performed              Past Medical History:  Diagnosis Date   Anemia    GERD (gastroesophageal reflux disease)    no meds   Past Surgical History:  Procedure Laterality Date   DILATION AND CURETTAGE OF UTERUS     MYOMECTOMY     There are no active problems to display for this patient.   PCP: [name]  REFERRING PROVIDER: Jerri Kay HERO, MD  REFERRING DIAG: [phone] (ICD-10-CM) - Primary osteoarthritis of right hip   Rationale for Evaluation and Treatment: Rehabilitation  THERAPY DIAG:  Muscle weakness (generalized)  Stiffness of right hip, not elsewhere classified  Pain in right hip  PERTINENT HISTORY: No pertinent PM  WEIGHT BEARING RESTRICTIONS: No  FALLS:  Has patient fallen in last 6 months? Yes. Number of falls fell while playing pickleball, tripped when rotating  LIVING ENVIRONMENT: Lives with: lives alone Lives in: House/apartment Stairs: no issues going up or down stairs most days. Has following equipment at home: None  OCCUPATION: semi-retired caregiver, recently.   PRECAUTIONS: None ---------------------------------------------------------------------------------------------  SUBJECTIVE:   SUBJECTIVE STATEMENT: Had some elevated soreness after working in the yard but symptoms resolved after 24 hrs.  Pain levels are 3/10 currently, no n/t   RED FLAGS: None   PLOF:  Independent  PATIENT GOALS: get more mobility   NEXT MD VISIT: cannot recall, maybe November. ---------------------------------------------------------------------------------------------  OBJECTIVE:  Note: Objective measures were completed at Evaluation unless otherwise noted.  DIAGNOSTIC FINDINGS: MPRESSION: Moderate right and moderate to severe left femoroacetabular osteoarthritis.     Electronically Signed   By: [name]   On: 01/17/2024 17:57  PATIENT SURVEYS:  LEFS  COGNITION: Overall cognitive status: Within functional limits for tasks assessed     SENSATION: WFL  EDEMA:  No swelling.   MUSCLE LENGTH: Hamstrings: Right 15 deg; Left 15 deg Thomas test: Right 20 deg; Left 15 deg  POSTURE: No Significant postural limitations  PALPATION: Tenderness around acetabular ridge  LOWER EXTREMITY ROM:  Active ROM Right eval Left eval  Hip flexion 110d 120d  Hip extension [facility]  Hip abduction    Hip adduction    Hip internal rotation 10d! [facility]  Hip external rotation 30d! [facility]  Knee flexion    Knee extension    Ankle dorsiflexion    Ankle plantarflexion    Ankle inversion    Ankle eversion     (Blank rows = not tested)  ! Indicates pain with testing  LOWER EXTREMITY MMT:  MMT Right eval Left eval  Hip flexion 4+ 4+  Hip extension 4 4  Hip abduction    Hip adduction    Hip internal rotation    Hip external rotation    Knee flexion 5 5  Knee extension 4 4-  Ankle dorsiflexion    Ankle plantarflexion  Ankle inversion    Ankle eversion     (Blank rows = not tested)  ! Indicates pain with testing  LOWER EXTREMITY SPECIAL TESTS:  Hip special tests: Belvie (FABER) test: positive  and Hip scouring test: positive   GAIT: Distance walked: 1105ft Assistive device utilized: None Level of assistance: Complete Independence Comments: flexed trunk, R pelvic oblqiuity during gait, R lateral lean.  [facility] Adult PT Treatment:                                                 DATE: 04/26/24 Therapeutic Exercise: Nustep L4 8 min Neuromuscular re-ed: Supine hip fallouts BluTB 10x B, 10/10 unilaterally S/L clams BluTB 10/10 Bridge against BluTB 10x Bridge with ball 15x Therapeutic Activity: R hip flexor stretch 60s x2 STS from Airex pad 10x arms crossed OH press with 5# KB Runners step 4 in step 10/10  [facility] Adult PT Treatment:                                                DATE: 04/22/24 Therapeutic Exercise: Nustep L4 8 min Neuromuscular re-ed: Supine hip fallouts GTB 15x B, 15/15 unilaterally S/L clams GTB 15/15 Bridge against GTB 15x Bridge with ball 15x Therapeutic Activity: R hip flexor stretch 60s x2 STS from Airex pad 10x arms crossed OH press with ball RLE LLD 60s  [facility] Adult PT Treatment:                                                DATE: 04/19/24 Therapeutic Exercise: Nustep L4 8 min Neuromuscular re-ed: Supine hip fallouts RTB 15x B, 15/15 unilaterally S/L clams RTB 15/15 Bridge against RTB 15x Bridge with ball 15x Therapeutic Activity: R hip flexor stretch 60s x2 STS from Airex pad 10x arms crossed  RLE LLD 60s   [facility] Adult PT Treatment:                                                DATE: 04/15/2024  Therapeutic Exercise: NuStep 8' lvl 6 Supine hip flexor stretch w/ contralateral KTC,  2x1'B , 5# cuff weight on stretching leg Gr III-V distraction of R hip  Therapeutic Activity: Side lying clamshells 2x12 RLE, hold 2s, RTB Side lying reverse clamshells 2x12 RLE, hold 2s Standing marches with resistance 2x12, hold 2s, RTB  [facility] Adult PT Treatment:                                                DATE: 04/01/2024 Self Care: Pt education, detailed below POC discussion    PATIENT EDUCATION:  Education details: Pt received education regarding HEP performance, ADL performance,  functional activity tolerance, impairment education, appropriate performance of therapeutic activities. Person educated: Patient Education method: Explanation, Demonstration, Tactile cues, Verbal cues, and Handouts Education comprehension: verbalized understanding and returned demonstration  HOME EXERCISE PROGRAM: Access Code: [email or web address] URL: [email or web address] Date: 04/01/2024 Prepared by: Mabel Kiang  Exercises - Sidelying Hip Abduction  - 1 x daily - 4 x weekly - 2-3 sets - 10 reps - 3s hold - Sidelying Hip Adduction  - 1 x daily - 4 x weekly - 2-3 sets - 10 reps - 3s hold - Supine Bridge with Resistance Band  - 1 x daily - 7 x weekly - 2-3 sets - 12 reps - 5s hold - Seated Hip Internal Rotation AROM  - 1-3 x daily - 7 x weekly - 2 sets - 15 reps - 2s hold - Seated Hip External Rotation AROM  - 1-3 x daily - 7 x weekly - 3 sets - 15 reps - 2s hold ---------------------------------------------------------------------------------------------  ASSESSMENT:  CLINICAL IMPRESSION: Symptoms and ADL tolerance improving.  Increased resistance as noted to challenge strength and stability.  Able to perform all requested tasks without exacerbating symptoms.   Eval impression (04/01/2024): Pt. attended today's physical therapy session for evaluation of R hip pain. Pt has complaints of 2/10 pain onset since last November, that gets worse after prolonged sitting or with general activity, especially first thing in the morning. Pt has notable deficits with R hip mobility, strength, gait pattern, and imagine reports of mod - severe B hip oa.Treatment performed today focused on pt education detailed in the objective. Pt demonstrated great understanding of education provided. required minimal v/t cues and no assistance for appropriate performance with today's activities. Pt requires the intervention of skilled outpatient physical therapy to address the aforementioned deficits and progress  towards a functional level in line with therapeutic goals.   OBJECTIVE IMPAIRMENTS: Abnormal gait, decreased mobility, difficulty walking, decreased ROM, decreased strength, hypomobility, improper body mechanics, and pain.   ACTIVITY LIMITATIONS: standing, squatting, dressing, and locomotion level  PARTICIPATION LIMITATIONS: community activity, occupation, and yard work  PERSONAL FACTORS: Age, Fitness, and Time since onset of injury/illness/exacerbation are also affecting patient's functional outcome.   REHAB POTENTIAL: Fair severe OA  CLINICAL DECISION MAKING: Stable/uncomplicated  EVALUATION COMPLEXITY: Low   GOALS: Goals reviewed with patient? YES  SHORT TERM GOALS: Target date: 04/22/2024 Pt will be independent with administered HEP to demonstrate the competency necessary for long term managemnet of symptoms at home.  Baseline: Goal status: Met  LONG TERM GOALS: Target date: 05/13/2024  Pt. Will achieve a LEFS score of 68/80 as to demonstrate improvement in self-perceived functional ability with daily activities.  Baseline: 59/80 Goal status: INITIAL  2.  Pt will improve Global Hip/knee strength to a 4+/5 to demonstrate improvement in strength for quality of motion and activity performance.  Baseline:  Goal status: INITIAL  3.   Pt will independently ambulate 20 minutes with no AD and less than 2/10 pain alongside a normalized gait pattern to demonstrate improved weightbearing tolerance, BLE strength, and functional capacity  for community ambulation. Baseline:  Goal status: INITIAL  4.  .Pt will report being able to participate in Pickleball to demonstrate return to PLOF with recreational activity Baseline: hasn't returned Goal status: INITIAL  --------------------------------------------------------------------------------------------- PLAN:  PT FREQUENCY: 1-2x/week  PT DURATION: 6 weeks  PLANNED INTERVENTIONS: 97110-Therapeutic exercises, 97530- Therapeutic  activity, 97112- Neuromuscular re-education, 97535- Self Care, [id number]- Manual therapy, [phone]- Gait training, [phone]- Aquatic Therapy, Patient/Family education, Balance training, Stair training, Taping, and Joint mobilization  PLAN FOR NEXT SESSION: Review HEP, Begin POC as detailed in the assessment   Chyrl Kohut PT  04/26/2024, 4:14 PM    Date of referral: 02/03/2024 Referring provider: Jerri Kay HERO, MD Referring diagnosis? Primary osteoarthritis of right hip [M16.11]  Treatment diagnosis? (if different than referring diagnosis) Hip weakness, R hip pain, gait deficits  What was this (referring dx) caused by? Arthritis  Nature of Condition: Chronic (continuous duration > 3 months)   Laterality: Rt  Current Functional Measure Score: LEFS 59/80  Objective measurements identify impairments when they are compared to normal values, the uninvolved extremity, and prior level of function.  [x]  Yes  []  No  Objective assessment of functional ability: Minimal functional limitations   Briefly describe symptoms: R hip pain since november  How did symptoms start: no moi  Average pain intensity:  Last 24 hours: 2/10  Past week: 4/10  How often does the pt experience symptoms? Constantly  How much have the symptoms interfered with usual daily activities? Moderately  How has condition changed since care began at this facility? NA - initial visit  In general, how is the patients overall health? Good   BACK PAIN (STarT Back Screening Tool) No

## 2024-04-28 ENCOUNTER — Ambulatory Visit

## 2024-04-28 DIAGNOSIS — M25551 Pain in right hip: Secondary | ICD-10-CM

## 2024-04-28 DIAGNOSIS — M25651 Stiffness of right hip, not elsewhere classified: Secondary | ICD-10-CM

## 2024-04-28 DIAGNOSIS — M6281 Muscle weakness (generalized): Secondary | ICD-10-CM | POA: Diagnosis not present

## 2024-04-28 DIAGNOSIS — R2689 Other abnormalities of gait and mobility: Secondary | ICD-10-CM

## 2024-04-28 NOTE — Therapy (Cosign Needed)
 OUTPATIENT PHYSICAL THERAPY TREATMENT NOTE   Patient Name: [name] MRN: [id number] DOB:1956/06/17, 68 y.o., female Today's Date: 04/28/2024  END OF SESSION:  PT End of Session - 04/28/24 1534     Visit Number 6    Number of Visits 13    Date for PT Re-Evaluation 05/13/24    Authorization Time Period 8 visits 7/31-9/11    Authorization - Visit Number 5    Authorization - Number of Visits 8    PT Start Time 1533    PT Stop Time 1613    PT Time Calculation (min) 40 min    Activity Tolerance Patient tolerated treatment well    Behavior During Therapy WFL for tasks assessed/performed          Past Medical History:  Diagnosis Date   Anemia    GERD (gastroesophageal reflux disease)    no meds   Past Surgical History:  Procedure Laterality Date   DILATION AND CURETTAGE OF UTERUS     MYOMECTOMY     There are no active problems to display for this patient.   PCP: [name]  REFERRING PROVIDER: Jerri Kay HERO, MD  REFERRING DIAG: [phone] (ICD-10-CM) - Primary osteoarthritis of right hip   Rationale for Evaluation and Treatment: Rehabilitation  THERAPY DIAG:  Muscle weakness (generalized)  Pain in right hip  Stiffness of right hip, not elsewhere classified  Other abnormalities of gait and mobility  PERTINENT HISTORY: No pertinent PM  WEIGHT BEARING RESTRICTIONS: No  FALLS:  Has patient fallen in last 6 months? Yes. Number of falls fell while playing pickleball, tripped when rotating  LIVING ENVIRONMENT: Lives with: lives alone Lives in: House/apartment Stairs: no issues going up or down stairs most days. Has following equipment at home: None  OCCUPATION: semi-retired caregiver, recently.   PRECAUTIONS: None ---------------------------------------------------------------------------------------------  SUBJECTIVE:   SUBJECTIVE STATEMENT:  Patient reports hip pain and stiffness are improving.  RED FLAGS: None   PLOF:  Independent  PATIENT GOALS: get more mobility   NEXT MD VISIT: cannot recall, maybe November. ---------------------------------------------------------------------------------------------  OBJECTIVE:  Note: Objective measures were completed at Evaluation unless otherwise noted.  DIAGNOSTIC FINDINGS: MPRESSION: Moderate right and moderate to severe left femoroacetabular osteoarthritis.     Electronically Signed   By: [name]   On: 01/17/2024 17:57  PATIENT SURVEYS:  LEFS  COGNITION: Overall cognitive status: Within functional limits for tasks assessed     SENSATION: WFL  EDEMA:  No swelling.   MUSCLE LENGTH: Hamstrings: Right 15 deg; Left 15 deg Thomas test: Right 20 deg; Left 15 deg  POSTURE: No Significant postural limitations  PALPATION: Tenderness around acetabular ridge  LOWER EXTREMITY ROM:  Active ROM Right eval Left eval  Hip flexion 110d 120d  Hip extension [facility]  Hip abduction    Hip adduction    Hip internal rotation 10d! [facility]  Hip external rotation 30d! [facility]  Knee flexion    Knee extension    Ankle dorsiflexion    Ankle plantarflexion    Ankle inversion    Ankle eversion     (Blank rows = not tested)  ! Indicates pain with testing  LOWER EXTREMITY MMT:  MMT Right eval Left eval  Hip flexion 4+ 4+  Hip extension 4 4  Hip abduction    Hip adduction    Hip internal rotation    Hip external rotation    Knee flexion 5 5  Knee extension 4 4-  Ankle dorsiflexion  Ankle plantarflexion    Ankle inversion    Ankle eversion     (Blank rows = not tested)  ! Indicates pain with testing  LOWER EXTREMITY SPECIAL TESTS:  Hip special tests: Belvie (FABER) test: positive  and Hip scouring test: positive   GAIT: Distance walked: 183ft Assistive device utilized: None Level of assistance: Complete Independence Comments: flexed trunk, R pelvic oblqiuity during gait, R lateral lean.  [facility] Adult PT Treatment:                                                 DATE: 04/28/24 Therapeutic Exercise: Nustep L4 8 min Neuromuscular re-ed: Supine hip fallouts BluTB 10x B, 10/10 unilaterally S/L clams BluTB 10/10 Bridge against BluTB 10x Therapeutic Activity: STS from Airex pad 10x arms crossed OH press with 5# KB Runners step 4 in step 10/10 Standing hip abduction/extension 2x10 ea BIL Standing alternating marching 3x30 - cues for increased hip flexion  [facility] Adult PT Treatment:                                                DATE: 04/26/24 Therapeutic Exercise: Nustep L4 8 min Neuromuscular re-ed: Supine hip fallouts BluTB 10x B, 10/10 unilaterally S/L clams BluTB 10/10 Bridge against BluTB 10x Bridge with ball 15x Therapeutic Activity: R hip flexor stretch 60s x2 STS from Airex pad 10x arms crossed OH press with 5# KB Runners step 4 in step 10/10  [facility] Adult PT Treatment:                                                DATE: 04/22/24 Therapeutic Exercise: Nustep L4 8 min Neuromuscular re-ed: Supine hip fallouts GTB 15x B, 15/15 unilaterally S/L clams GTB 15/15 Bridge against GTB 15x Bridge with ball 15x Therapeutic Activity: R hip flexor stretch 60s x2 STS from Airex pad 10x arms crossed OH press with ball RLE LLD 60s   PATIENT EDUCATION:  Education details: Pt received education regarding HEP performance, ADL performance, functional activity tolerance, impairment education, appropriate performance of therapeutic activities. Person educated: Patient Education method: Explanation, Demonstration, Tactile cues, Verbal cues, and Handouts Education comprehension: verbalized understanding and returned demonstration  HOME EXERCISE PROGRAM: Access Code: [email or web address] URL: [email or web address] Date: 04/01/2024 Prepared by: Mabel Kiang  Exercises - Sidelying Hip Abduction  - 1 x daily - 4 x weekly - 2-3 sets - 10 reps - 3s hold - Sidelying Hip Adduction  - 1 x daily - 4 x weekly - 2-3 sets - 10  reps - 3s hold - Supine Bridge with Resistance Band  - 1 x daily - 7 x weekly - 2-3 sets - 12 reps - 5s hold - Seated Hip Internal Rotation AROM  - 1-3 x daily - 7 x weekly - 2 sets - 15 reps - 2s hold - Seated Hip External Rotation AROM  - 1-3 x daily - 7 x weekly - 3 sets - 15 reps - 2s hold ---------------------------------------------------------------------------------------------  ASSESSMENT:  CLINICAL IMPRESSION:  Patient presents to PT reporting improvements in her hip pain and stiffness. Session today focused  on proximal hip strengthening with more standing exercises incorporated today. Patient was able to tolerate all prescribed exercises with no adverse effects. Patient continues to benefit from skilled PT services and should be progressed as able to improve functional independence.   Eval impression (04/01/2024): Pt. attended today's physical therapy session for evaluation of R hip pain. Pt has complaints of 2/10 pain onset since last November, that gets worse after prolonged sitting or with general activity, especially first thing in the morning. Pt has notable deficits with R hip mobility, strength, gait pattern, and imagine reports of mod - severe B hip oa.Treatment performed today focused on pt education detailed in the objective. Pt demonstrated great understanding of education provided. required minimal v/t cues and no assistance for appropriate performance with today's activities. Pt requires the intervention of skilled outpatient physical therapy to address the aforementioned deficits and progress towards a functional level in line with therapeutic goals.   OBJECTIVE IMPAIRMENTS: Abnormal gait, decreased mobility, difficulty walking, decreased ROM, decreased strength, hypomobility, improper body mechanics, and pain.   ACTIVITY LIMITATIONS: standing, squatting, dressing, and locomotion level  PARTICIPATION LIMITATIONS: community activity, occupation, and yard work  PERSONAL  FACTORS: Age, Fitness, and Time since onset of injury/illness/exacerbation are also affecting patient's functional outcome.   REHAB POTENTIAL: Fair severe OA  CLINICAL DECISION MAKING: Stable/uncomplicated  EVALUATION COMPLEXITY: Low   GOALS: Goals reviewed with patient? YES  SHORT TERM GOALS: Target date: 04/22/2024 Pt will be independent with administered HEP to demonstrate the competency necessary for long term managemnet of symptoms at home.  Baseline: Goal status: Met  LONG TERM GOALS: Target date: 05/13/2024  Pt. Will achieve a LEFS score of 68/80 as to demonstrate improvement in self-perceived functional ability with daily activities.  Baseline: 59/80 Goal status: INITIAL  2.  Pt will improve Global Hip/knee strength to a 4+/5 to demonstrate improvement in strength for quality of motion and activity performance.  Baseline:  Goal status: INITIAL  3.   Pt will independently ambulate 20 minutes with no AD and less than 2/10 pain alongside a normalized gait pattern to demonstrate improved weightbearing tolerance, BLE strength, and functional capacity for community ambulation. Baseline:  Goal status: INITIAL  4.  .Pt will report being able to participate in Pickleball to demonstrate return to PLOF with recreational activity Baseline: hasn't returned Goal status: INITIAL  --------------------------------------------------------------------------------------------- PLAN:  PT FREQUENCY: 1-2x/week  PT DURATION: 6 weeks  PLANNED INTERVENTIONS: 97110-Therapeutic exercises, 97530- Therapeutic activity, 97112- Neuromuscular re-education, 97535- Self Care, 02859- Manual therapy, [phone]- Gait training, [phone]- Aquatic Therapy, Patient/Family education, Balance training, Stair training, Taping, and Joint mobilization  PLAN FOR NEXT SESSION: Review HEP, Begin POC as detailed in the assessment   Corean Pouch, PTA  04/28/2024, 4:34 PM    Date of referral:  02/03/2024 Referring provider: Jerri Kay HERO, MD Referring diagnosis? Primary osteoarthritis of right hip [M16.11]  Treatment diagnosis? (if different than referring diagnosis) Hip weakness, R hip pain, gait deficits  What was this (referring dx) caused by? Arthritis  Nature of Condition: Chronic (continuous duration > 3 months)   Laterality: Rt  Current Functional Measure Score: LEFS 59/80  Objective measurements identify impairments when they are compared to normal values, the uninvolved extremity, and prior level of function.  [x]  Yes  []  No  Objective assessment of functional ability: Minimal functional limitations   Briefly describe symptoms: R hip pain since november  How did symptoms start: no moi  Average pain intensity:  Last 24 hours: 2/10  Past  week: 4/10  How often does the pt experience symptoms? Constantly  How much have the symptoms interfered with usual daily activities? Moderately  How has condition changed since care began at this facility? NA - initial visit  In general, how is the patients overall health? Good   BACK PAIN (STarT Back Screening Tool) No

## 2024-05-06 ENCOUNTER — Encounter: Payer: Self-pay | Admitting: Physical Therapy

## 2024-05-06 ENCOUNTER — Ambulatory Visit: Attending: Orthopaedic Surgery | Admitting: Physical Therapy

## 2024-05-06 DIAGNOSIS — M6281 Muscle weakness (generalized): Secondary | ICD-10-CM | POA: Diagnosis present

## 2024-05-06 DIAGNOSIS — R2689 Other abnormalities of gait and mobility: Secondary | ICD-10-CM | POA: Insufficient documentation

## 2024-05-06 DIAGNOSIS — M25551 Pain in right hip: Secondary | ICD-10-CM | POA: Diagnosis present

## 2024-05-06 DIAGNOSIS — M25651 Stiffness of right hip, not elsewhere classified: Secondary | ICD-10-CM | POA: Insufficient documentation

## 2024-05-06 NOTE — Therapy (Signed)
 OUTPATIENT PHYSICAL THERAPY TREATMENT NOTE   Patient Name: [name] MRN: [id number] DOB:Dec 10, 1955, 68 y.o., female Today's Date: 05/06/2024  END OF SESSION:  PT End of Session - 05/06/24 1223     Visit Number 7    Number of Visits 13    Date for PT Re-Evaluation 05/13/24    Authorization Time Period 8 visits 7/31-9/11    Authorization - Visit Number 6    Authorization - Number of Visits 8    PT Start Time 1223    PT Stop Time 1301    PT Time Calculation (min) 38 min    Activity Tolerance Patient tolerated treatment well    Behavior During Therapy WFL for tasks assessed/performed          Past Medical History:  Diagnosis Date   Anemia    GERD (gastroesophageal reflux disease)    no meds   Past Surgical History:  Procedure Laterality Date   DILATION AND CURETTAGE OF UTERUS     MYOMECTOMY     There are no active problems to display for this patient.   PCP: [name]  REFERRING PROVIDER: Jerri Kay HERO, MD  REFERRING DIAG: [phone] (ICD-10-CM) - Primary osteoarthritis of right hip   Rationale for Evaluation and Treatment: Rehabilitation  THERAPY DIAG:  Muscle weakness (generalized)  Pain in right hip  Stiffness of right hip, not elsewhere classified  Other abnormalities of gait and mobility  PERTINENT HISTORY: No pertinent PM  WEIGHT BEARING RESTRICTIONS: No  FALLS:  Has patient fallen in last 6 months? Yes. Number of falls fell while playing pickleball, tripped when rotating  LIVING ENVIRONMENT: Lives with: lives alone Lives in: House/apartment Stairs: no issues going up or down stairs most days. Has following equipment at home: None  OCCUPATION: semi-retired caregiver, recently.   PRECAUTIONS: None ---------------------------------------------------------------------------------------------  SUBJECTIVE:   SUBJECTIVE STATEMENT:  Pt attended today's session with reports of 0/10 pain. Pt stated that they have maintained  great compliance with current HEP.  Only notices symptoms with prolonged ambulation, maybe 2-3 miles.   RED FLAGS: None   PLOF: Independent  PATIENT GOALS: get more mobility   NEXT MD VISIT: cannot recall, maybe November. ---------------------------------------------------------------------------------------------  OBJECTIVE:  Note: Objective measures were completed at Evaluation unless otherwise noted.  DIAGNOSTIC FINDINGS: MPRESSION: Moderate right and moderate to severe left femoroacetabular osteoarthritis.     Electronically Signed   By: [name]   On: 01/17/2024 17:57  PATIENT SURVEYS:  LEFS  COGNITION: Overall cognitive status: Within functional limits for tasks assessed     SENSATION: WFL  EDEMA:  No swelling.   MUSCLE LENGTH: Hamstrings: Right 15 deg; Left 15 deg Thomas test: Right 20 deg; Left 15 deg  POSTURE: No Significant postural limitations  PALPATION: Tenderness around acetabular ridge  LOWER EXTREMITY ROM:  Active ROM Right eval Left eval  Hip flexion 110d 120d  Hip extension [facility]  Hip abduction    Hip adduction    Hip internal rotation 10d! [facility]  Hip external rotation 30d! [facility]  Knee flexion    Knee extension    Ankle dorsiflexion    Ankle plantarflexion    Ankle inversion    Ankle eversion     (Blank rows = not tested)  ! Indicates pain with testing  LOWER EXTREMITY MMT:  MMT Right eval Left eval  Hip flexion 4+ 4+  Hip extension 4 4  Hip abduction    Hip adduction    Hip internal rotation  Hip external rotation    Knee flexion 5 5  Knee extension 4 4-  Ankle dorsiflexion    Ankle plantarflexion    Ankle inversion    Ankle eversion     (Blank rows = not tested)  ! Indicates pain with testing  LOWER EXTREMITY SPECIAL TESTS:  Hip special tests: Belvie (FABER) test: positive  and Hip scouring test: positive   GAIT: Distance walked: 167ft Assistive device utilized: None Level of assistance:  Complete Independence Comments: flexed trunk, R pelvic oblqiuity during gait, R lateral lean.  [facility] Adult PT Treatment:                                                DATE: 05/06/2024  Therapeutic Exercise: NuStep 8' Standing B hip flexor stretch 2x1'B TRX squat 1x10 Therapeutic Activity: Runners step 1x12, 5# KB Resisted standing marching 2x12B, hold 1s, YTB Bridge on pball 2x12 Sidestepping at counter 2x3 laps, YTB    [facility] Adult PT Treatment:                                                DATE: 04/28/24 Therapeutic Exercise: Nustep L4 8 min Neuromuscular re-ed: Supine hip fallouts BluTB 10x B, 10/10 unilaterally S/L clams BluTB 10/10 Bridge against BluTB 10x Therapeutic Activity: STS from Airex pad 10x arms crossed OH press with 5# KB Runners step 4 in step 10/10 Standing hip abduction/extension 2x10 ea BIL Standing alternating marching 3x30 - cues for increased hip flexion  [facility] Adult PT Treatment:                                                DATE: 04/26/24 Therapeutic Exercise: Nustep L4 8 min Neuromuscular re-ed: Supine hip fallouts BluTB 10x B, 10/10 unilaterally S/L clams BluTB 10/10 Bridge against BluTB 10x Bridge with ball 15x Therapeutic Activity: R hip flexor stretch 60s x2 STS from Airex pad 10x arms crossed OH press with 5# KB Runners step 4 in step 10/10  PATIENT EDUCATION:  Education details: Pt received education regarding HEP performance, ADL performance, functional activity tolerance, impairment education, appropriate performance of therapeutic activities. Person educated: Patient Education method: Explanation, Demonstration, Tactile cues, Verbal cues, and Handouts Education comprehension: verbalized understanding and returned demonstration  HOME EXERCISE PROGRAM: Access Code: [email or web address] URL: [email or web address] Date: 04/01/2024 Prepared by: Mabel Kiang  Exercises - Sidelying Hip Abduction  - 1 x daily - 4 x weekly - 2-3  sets - 10 reps - 3s hold - Sidelying Hip Adduction  - 1 x daily - 4 x weekly - 2-3 sets - 10 reps - 3s hold - Supine Bridge with Resistance Band  - 1 x daily - 7 x weekly - 2-3 sets - 12 reps - 5s hold - Seated Hip Internal Rotation AROM  - 1-3 x daily - 7 x weekly - 2 sets - 15 reps - 2s hold - Seated Hip External Rotation AROM  - 1-3 x daily - 7 x weekly - 3 sets - 15 reps - 2s hold ---------------------------------------------------------------------------------------------  ASSESSMENT:  CLINICAL IMPRESSION: Pt attended physical therapy session for  continuation of treatment regarding R hip pain. Today's treatment focused on improvement of  proximal hip strength and stabilization with dynamic activity. Pt is progressing with reports of no pain unless she is walking more than 3 miles. Pt showed great tolerance to administered treatment with no adverse effects by the end of session. Skilled intervention was utilized via activity modification for pt tolerance with task completion, functional progression/regression promoting best outcomes inline with current rehab goals, as well as minimal verbal/tactile cuing alongside no physical assistance for safe and appropriate performance of today's activities. Continue to progress within current POC focus.    Eval impression (04/01/2024): Pt. attended today's physical therapy session for evaluation of R hip pain. Pt has complaints of 2/10 pain onset since last November, that gets worse after prolonged sitting or with general activity, especially first thing in the morning. Pt has notable deficits with R hip mobility, strength, gait pattern, and imagine reports of mod - severe B hip oa.Treatment performed today focused on pt education detailed in the objective. Pt demonstrated great understanding of education provided. required minimal v/t cues and no assistance for appropriate performance with today's activities. Pt requires the intervention of skilled outpatient  physical therapy to address the aforementioned deficits and progress towards a functional level in line with therapeutic goals.   OBJECTIVE IMPAIRMENTS: Abnormal gait, decreased mobility, difficulty walking, decreased ROM, decreased strength, hypomobility, improper body mechanics, and pain.   ACTIVITY LIMITATIONS: standing, squatting, dressing, and locomotion level  PARTICIPATION LIMITATIONS: community activity, occupation, and yard work  PERSONAL FACTORS: Age, Fitness, and Time since onset of injury/illness/exacerbation are also affecting patient's functional outcome.   REHAB POTENTIAL: Fair severe OA  CLINICAL DECISION MAKING: Stable/uncomplicated  EVALUATION COMPLEXITY: Low   GOALS: Goals reviewed with patient? YES  SHORT TERM GOALS: Target date: 04/22/2024 Pt will be independent with administered HEP to demonstrate the competency necessary for long term managemnet of symptoms at home.  Baseline: Goal status: Met  LONG TERM GOALS: Target date: 05/13/2024  Pt. Will achieve a LEFS score of 68/80 as to demonstrate improvement in self-perceived functional ability with daily activities.  Baseline: 59/80 Goal status: INITIAL  2.  Pt will improve Global Hip/knee strength to a 4+/5 to demonstrate improvement in strength for quality of motion and activity performance.  Baseline:  Goal status: INITIAL  3.   Pt will independently ambulate 20 minutes with no AD and less than 2/10 pain alongside a normalized gait pattern to demonstrate improved weightbearing tolerance, BLE strength, and functional capacity for community ambulation. Baseline:  Goal status: INITIAL  4.  .Pt will report being able to participate in Pickleball to demonstrate return to PLOF with recreational activity Baseline: hasn't returned Goal status: INITIAL  --------------------------------------------------------------------------------------------- PLAN:  PT FREQUENCY: 1-2x/week  PT DURATION: 6  weeks  PLANNED INTERVENTIONS: 97110-Therapeutic exercises, 97530- Therapeutic activity, 97112- Neuromuscular re-education, 97535- Self Care, 02859- Manual therapy, [phone]- Gait training, [phone]- Aquatic Therapy, Patient/Family education, Balance training, Stair training, Taping, and Joint mobilization  PLAN FOR NEXT SESSION: Review HEP, Begin POC as detailed in the assessment   Mabel Kiang, PT, DPT 05/06/2024, 1:05 PM    Date of referral: 02/03/2024 Referring provider: Jerri Kay HERO, MD Referring diagnosis? Primary osteoarthritis of right hip [M16.11]  Treatment diagnosis? (if different than referring diagnosis) Hip weakness, R hip pain, gait deficits  What was this (referring dx) caused by? Arthritis  Nature of Condition: Chronic (continuous duration > 3 months)   Laterality: Rt  Current Functional Measure Score: LEFS 59/80  Objective measurements identify impairments when they are compared to normal values, the uninvolved extremity, and prior level of function.  [x]  Yes  []  No  Objective assessment of functional ability: Minimal functional limitations   Briefly describe symptoms: R hip pain since november  How did symptoms start: no moi  Average pain intensity:  Last 24 hours: 2/10  Past week: 4/10  How often does the pt experience symptoms? Constantly  How much have the symptoms interfered with usual daily activities? Moderately  How has condition changed since care began at this facility? NA - initial visit  In general, how is the patients overall health? Good   BACK PAIN (STarT Back Screening Tool) No

## 2024-05-10 ENCOUNTER — Encounter: Admitting: Physical Therapy

## 2024-05-11 ENCOUNTER — Encounter: Payer: Self-pay | Admitting: Physical Therapy

## 2024-05-11 ENCOUNTER — Ambulatory Visit: Admitting: Physical Therapy

## 2024-05-11 DIAGNOSIS — M6281 Muscle weakness (generalized): Secondary | ICD-10-CM

## 2024-05-11 DIAGNOSIS — M25651 Stiffness of right hip, not elsewhere classified: Secondary | ICD-10-CM

## 2024-05-11 DIAGNOSIS — R2689 Other abnormalities of gait and mobility: Secondary | ICD-10-CM

## 2024-05-11 DIAGNOSIS — M25551 Pain in right hip: Secondary | ICD-10-CM

## 2024-05-11 NOTE — Therapy (Signed)
 OUTPATIENT PHYSICAL THERAPY TREATMENT NOTE   Patient Name: [name] MRN: [id number] DOB:[birth year], 68 y.o., female Today's Date: 05/11/2024  END OF SESSION:  PT End of Session - 05/11/24 1220     Visit Number 8    Number of Visits 13    Date for PT Re-Evaluation 05/13/24    Authorization Time Period 8 visits 7/31-9/11    Authorization - Visit Number 7    Authorization - Number of Visits 8    PT Start Time 1220    PT Stop Time 1258    PT Time Calculation (min) 38 min    Activity Tolerance Patient tolerated treatment well    Behavior During Therapy WFL for tasks assessed/performed          Past Medical History:  Diagnosis Date   Anemia    GERD (gastroesophageal reflux disease)    no meds   Past Surgical History:  Procedure Laterality Date   DILATION AND CURETTAGE OF UTERUS     MYOMECTOMY     There are no active problems to display for this patient.   PCP: [name]  REFERRING PROVIDER: Jerri Kay HERO, MD  REFERRING DIAG: [phone] (ICD-10-CM) - Primary osteoarthritis of right hip   Rationale for Evaluation and Treatment: Rehabilitation  THERAPY DIAG:  Muscle weakness (generalized)  Pain in right hip  Stiffness of right hip, not elsewhere classified  Other abnormalities of gait and mobility  PERTINENT HISTORY: No pertinent PM  WEIGHT BEARING RESTRICTIONS: No  FALLS:  Has patient fallen in last 6 months? Yes. Number of falls fell while playing pickleball, tripped when rotating  LIVING ENVIRONMENT: Lives with: lives alone Lives in: House/apartment Stairs: no issues going up or down stairs most days. Has following equipment at home: None  OCCUPATION: semi-retired caregiver, recently.   PRECAUTIONS: None ---------------------------------------------------------------------------------------------  SUBJECTIVE:   SUBJECTIVE STATEMENT:  Pt attended today's session with reports of 0/10 pain. Pt stated that they have maintained  great compliance with current HEP.  Notices her walking is continuing to improve.   RED FLAGS: None   PLOF: Independent  PATIENT GOALS: get more mobility   NEXT MD VISIT: cannot recall, maybe November. ---------------------------------------------------------------------------------------------  OBJECTIVE:  Note: Objective measures were completed at Evaluation unless otherwise noted.  DIAGNOSTIC FINDINGS: MPRESSION: Moderate right and moderate to severe left femoroacetabular osteoarthritis.     Electronically Signed   By: [name]   On: 01/17/2024 17:57  PATIENT SURVEYS:  LEFS  COGNITION: Overall cognitive status: Within functional limits for tasks assessed     SENSATION: WFL  EDEMA:  No swelling.   MUSCLE LENGTH: Hamstrings: Right 15 deg; Left 15 deg Thomas test: Right 20 deg; Left 15 deg  POSTURE: No Significant postural limitations  PALPATION: Tenderness around acetabular ridge  LOWER EXTREMITY ROM:  Active ROM Right eval Left eval  Hip flexion 110d 120d  Hip extension [facility]  Hip abduction    Hip adduction    Hip internal rotation 10d! [facility]  Hip external rotation 30d! [facility]  Knee flexion    Knee extension    Ankle dorsiflexion    Ankle plantarflexion    Ankle inversion    Ankle eversion     (Blank rows = not tested)  ! Indicates pain with testing  LOWER EXTREMITY MMT:  MMT Right eval Left eval  Hip flexion 4+ 4+  Hip extension 4 4  Hip abduction    Hip adduction    Hip internal rotation  Hip external rotation    Knee flexion 5 5  Knee extension 4 4-  Ankle dorsiflexion    Ankle plantarflexion    Ankle inversion    Ankle eversion     (Blank rows = not tested)  ! Indicates pain with testing  LOWER EXTREMITY SPECIAL TESTS:  Hip special tests: Belvie (FABER) test: positive  and Hip scouring test: positive   GAIT: Distance walked: 132ft Assistive device utilized: None Level of assistance: Complete  Independence Comments: flexed trunk, R pelvic oblqiuity during gait, R lateral lean.  [facility] Adult PT Treatment:                                                DATE: 05/11/2024  Therapeutic Exercise: NuStep 8' Standing hip flexor stretch 2x1' B TRX squat 2x12 Therapeutic Activity: Standing hip 3 way 1x12, hold 2s, RTB Resisted standing marching 2x12, hold 1s, RTB Bridge on pball 2x12, hold 2s   [facility] Adult PT Treatment:                                                DATE: 05/06/2024  Therapeutic Exercise: NuStep 8' Standing B hip flexor stretch 2x1'B TRX squat 1x10 Therapeutic Activity: Runners step 1x12, 5# KB Resisted standing marching 2x12B, hold 1s, YTB Bridge on pball 2x12 Sidestepping at counter 2x3 laps, YTB    [facility] Adult PT Treatment:                                                DATE: 04/28/24 Therapeutic Exercise: Nustep L4 8 min Neuromuscular re-ed: Supine hip fallouts BluTB 10x B, 10/10 unilaterally S/L clams BluTB 10/10 Bridge against BluTB 10x Therapeutic Activity: STS from Airex pad 10x arms crossed OH press with 5# KB Runners step 4 in step 10/10 Standing hip abduction/extension 2x10 ea BIL Standing alternating marching 3x30 - cues for increased hip flexion  [facility] Adult PT Treatment:                                                DATE: 04/26/24 Therapeutic Exercise: Nustep L4 8 min Neuromuscular re-ed: Supine hip fallouts BluTB 10x B, 10/10 unilaterally S/L clams BluTB 10/10 Bridge against BluTB 10x Bridge with ball 15x Therapeutic Activity: R hip flexor stretch 60s x2 STS from Airex pad 10x arms crossed OH press with 5# KB Runners step 4 in step 10/10  PATIENT EDUCATION:  Education details: Pt received education regarding HEP performance, ADL performance, functional activity tolerance, impairment education, appropriate performance of therapeutic activities. Person educated: Patient Education method: Explanation, Demonstration, Tactile  cues, Verbal cues, and Handouts Education comprehension: verbalized understanding and returned demonstration  HOME EXERCISE PROGRAM: Access Code: [email or web address] URL: [email or web address] Date: 04/01/2024 Prepared by: Mabel Kiang  Exercises - Sidelying Hip Abduction  - 1 x daily - 4 x weekly - 2-3 sets - 10 reps - 3s hold - Sidelying Hip Adduction  - 1 x daily - 4 x weekly -  2-3 sets - 10 reps - 3s hold - Supine Bridge with Resistance Band  - 1 x daily - 7 x weekly - 2-3 sets - 12 reps - 5s hold - Seated Hip Internal Rotation AROM  - 1-3 x daily - 7 x weekly - 2 sets - 15 reps - 2s hold - Seated Hip External Rotation AROM  - 1-3 x daily - 7 x weekly - 3 sets - 15 reps - 2s hold ---------------------------------------------------------------------------------------------  ASSESSMENT:  CLINICAL IMPRESSION: Pt attended physical therapy session for continuation of treatment regarding R hip pain. Today's treatment focused on improvement of  proximal hip strength and stabilization with dynamic activity. Pt is progressing with reports of no pain and gait quality improving. Pt showed great tolerance to administered treatment with no adverse effects by the end of session. Skilled intervention was utilized via activity modification for pt tolerance with task completion, functional progression/regression promoting best outcomes inline with current rehab goals, as well as minimal verbal/tactile cuing alongside no physical assistance for safe and appropriate performance of today's activities. Re-eval next visit and consider d/c with long term HEP.    Eval impression (04/01/2024): Pt. attended today's physical therapy session for evaluation of R hip pain. Pt has complaints of 2/10 pain onset since last November, that gets worse after prolonged sitting or with general activity, especially first thing in the morning. Pt has notable deficits with R hip mobility, strength, gait pattern, and imagine  reports of mod - severe B hip oa.Treatment performed today focused on pt education detailed in the objective. Pt demonstrated great understanding of education provided. required minimal v/t cues and no assistance for appropriate performance with today's activities. Pt requires the intervention of skilled outpatient physical therapy to address the aforementioned deficits and progress towards a functional level in line with therapeutic goals.   OBJECTIVE IMPAIRMENTS: Abnormal gait, decreased mobility, difficulty walking, decreased ROM, decreased strength, hypomobility, improper body mechanics, and pain.   ACTIVITY LIMITATIONS: standing, squatting, dressing, and locomotion level  PARTICIPATION LIMITATIONS: community activity, occupation, and yard work  PERSONAL FACTORS: Age, Fitness, and Time since onset of injury/illness/exacerbation are also affecting patient's functional outcome.   REHAB POTENTIAL: Fair severe OA  CLINICAL DECISION MAKING: Stable/uncomplicated  EVALUATION COMPLEXITY: Low   GOALS: Goals reviewed with patient? YES  SHORT TERM GOALS: Target date: 04/22/2024 Pt will be independent with administered HEP to demonstrate the competency necessary for long term managemnet of symptoms at home.  Baseline: Goal status: Met  LONG TERM GOALS: Target date: 05/13/2024  Pt. Will achieve a LEFS score of 68/80 as to demonstrate improvement in self-perceived functional ability with daily activities.  Baseline: 59/80 Goal status: INITIAL  2.  Pt will improve Global Hip/knee strength to a 4+/5 to demonstrate improvement in strength for quality of motion and activity performance.  Baseline:  Goal status: INITIAL  3.   Pt will independently ambulate 20 minutes with no AD and less than 2/10 pain alongside a normalized gait pattern to demonstrate improved weightbearing tolerance, BLE strength, and functional capacity for community ambulation. Baseline:  Goal status: INITIAL  4.  .Pt  will report being able to participate in Pickleball to demonstrate return to PLOF with recreational activity Baseline: hasn't returned Goal status: INITIAL  --------------------------------------------------------------------------------------------- PLAN:  PT FREQUENCY: 1-2x/week  PT DURATION: 6 weeks  PLANNED INTERVENTIONS: 97110-Therapeutic exercises, 97530- Therapeutic activity, [id number]- Neuromuscular re-education, 97535- Self Care, 02859- Manual therapy, [phone]- Gait training, [phone]- Aquatic Therapy, Patient/Family education, Balance training, Stair training, Taping, and  Joint mobilization  PLAN FOR NEXT SESSION: Review HEP, Begin POC as detailed in the assessment   Mabel Kiang, PT, DPT 05/11/2024, 12:58 PM    Date of referral: 02/03/2024 Referring provider: Jerri Kay HERO, MD Referring diagnosis? Primary osteoarthritis of right hip [M16.11]  Treatment diagnosis? (if different than referring diagnosis) Hip weakness, R hip pain, gait deficits  What was this (referring dx) caused by? Arthritis  Nature of Condition: Chronic (continuous duration > 3 months)   Laterality: Rt  Current Functional Measure Score: LEFS 59/80  Objective measurements identify impairments when they are compared to normal values, the uninvolved extremity, and prior level of function.  [x]  Yes  []  No  Objective assessment of functional ability: Minimal functional limitations   Briefly describe symptoms: R hip pain since november  How did symptoms start: no moi  Average pain intensity:  Last 24 hours: 2/10  Past week: 4/10  How often does the pt experience symptoms? Constantly  How much have the symptoms interfered with usual daily activities? Moderately  How has condition changed since care began at this facility? NA - initial visit  In general, how is the patients overall health? Good   BACK PAIN (STarT Back Screening Tool) No

## 2024-05-13 ENCOUNTER — Ambulatory Visit: Admitting: Physical Therapy

## 2024-05-13 ENCOUNTER — Encounter: Payer: Self-pay | Admitting: Physical Therapy

## 2024-05-13 DIAGNOSIS — M6281 Muscle weakness (generalized): Secondary | ICD-10-CM | POA: Diagnosis not present

## 2024-05-13 DIAGNOSIS — M25551 Pain in right hip: Secondary | ICD-10-CM

## 2024-05-13 DIAGNOSIS — M25651 Stiffness of right hip, not elsewhere classified: Secondary | ICD-10-CM

## 2024-05-13 DIAGNOSIS — R2689 Other abnormalities of gait and mobility: Secondary | ICD-10-CM

## 2024-05-13 NOTE — Therapy (Signed)
 OUTPATIENT PHYSICAL THERAPY TREATMENT NOTE   Patient Name: [name] MRN: [id number] DOB:19-Sep-1955, 68 y.o., female Today's Date: 05/13/2024  END OF SESSION:  PT End of Session - 05/13/24 1404     Visit Number 9    Number of Visits 13    Date for PT Re-Evaluation 05/13/24    Authorization Time Period 8 visits 7/31-9/11    Authorization - Visit Number 8    Authorization - Number of Visits 8    PT Start Time 1404    PT Stop Time 1434    PT Time Calculation (min) 30 min    Activity Tolerance Patient tolerated treatment well    Behavior During Therapy WFL for tasks assessed/performed          Past Medical History:  Diagnosis Date   Anemia    GERD (gastroesophageal reflux disease)    no meds   Past Surgical History:  Procedure Laterality Date   DILATION AND CURETTAGE OF UTERUS     MYOMECTOMY     There are no active problems to display for this patient.   PCP: [name] COME  REFERRING PROVIDER: Jerri Kay HERO, MD  REFERRING DIAG: [phone] (ICD-10-CM) - Primary osteoarthritis of right hip   Rationale for Evaluation and Treatment: Rehabilitation  THERAPY DIAG:  Muscle weakness (generalized)  Pain in right hip  Stiffness of right hip, not elsewhere classified  Other abnormalities of gait and mobility  PERTINENT HISTORY: No pertinent PM  WEIGHT BEARING RESTRICTIONS: No  FALLS:  Has patient fallen in last 6 months? Yes. Number of falls fell while playing pickleball, tripped when rotating  LIVING ENVIRONMENT: Lives with: lives alone Lives in: House/apartment Stairs: no issues going up or down stairs most days. Has following equipment at home: None  OCCUPATION: semi-retired caregiver, recently.   PRECAUTIONS: None ---------------------------------------------------------------------------------------------  SUBJECTIVE:   SUBJECTIVE STATEMENT:  Pt attended today's session with reports of 0/10 pain. Pt stated that they have maintained  great compliance with current HEP.  Was able to do all her lawn care yesterday and go to a line dance.   RED FLAGS: None   PLOF: Independent  PATIENT GOALS: get more mobility   NEXT MD VISIT: cannot recall, maybe November. ---------------------------------------------------------------------------------------------  OBJECTIVE:  Note: Objective measures were completed at Evaluation unless otherwise noted.  DIAGNOSTIC FINDINGS: MPRESSION: Moderate right and moderate to severe left femoroacetabular osteoarthritis.     Electronically Signed   By: [name] M.D.   On: 01/17/2024 17:57  PATIENT SURVEYS:  LEFS: 59/80 05/13/2024: 74/80  COGNITION: Overall cognitive status: Within functional limits for tasks assessed     SENSATION: WFL  EDEMA:  No swelling.   MUSCLE LENGTH: Hamstrings: Right 15 deg; Left 15 deg Thomas test: Right 20 deg; Left 15 deg  POSTURE: No Significant postural limitations  PALPATION: Tenderness around acetabular ridge  LOWER EXTREMITY ROM:  Active ROM Right eval Left eval  Hip flexion 110d 120d  Hip extension [facility]  Hip abduction    Hip adduction    Hip internal rotation 10d! [facility]  Hip external rotation 30d! [facility]  Knee flexion    Knee extension    Ankle dorsiflexion    Ankle plantarflexion    Ankle inversion    Ankle eversion     (Blank rows = not tested)  ! Indicates pain with testing  LOWER EXTREMITY MMT:  MMT Right eval 05/13/2024 Left eval 05/13/2024  Hip flexion 4+ 5 4+ 5  Hip extension 4 4+ 4 4+  Hip abduction      Hip adduction      Hip internal rotation      Hip external rotation      Knee flexion 5 5 5 5   Knee extension 4 5 4- 5  Ankle dorsiflexion      Ankle plantarflexion      Ankle inversion      Ankle eversion       (Blank rows = not tested)  ! Indicates pain with testing  LOWER EXTREMITY SPECIAL TESTS:  Hip special tests: Belvie (FABER) test: positive  and Hip scouring test: positive    GAIT: Distance walked: 127ft Assistive device utilized: None Level of assistance: Complete Independence Comments: flexed trunk, R pelvic oblqiuity during gait, R lateral lean.   [facility] Adult PT Treatment:                                                DATE: 05/13/2024  Therapeutic Activity: Objective measures Goal assessment, functional testing Self Care: POC discussion Pt education    [facility] Adult PT Treatment:                                                DATE: 05/11/2024  Therapeutic Exercise: NuStep 8' Standing hip flexor stretch 2x1' B TRX squat 2x12 Therapeutic Activity: Standing hip 3 way 1x12, hold 2s, RTB Resisted standing marching 2x12, hold 1s, RTB Bridge on pball 2x12, hold 2s   [facility] Adult PT Treatment:                                                DATE: 05/06/2024  Therapeutic Exercise: NuStep 8' Standing B hip flexor stretch 2x1'B TRX squat 1x10 Therapeutic Activity: Runners step 1x12, 5# KB Resisted standing marching 2x12B, hold 1s, YTB Bridge on pball 2x12 Sidestepping at counter 2x3 laps, YTB    [facility] Adult PT Treatment:                                                DATE: 04/28/24 Therapeutic Exercise: Nustep L4 8 min Neuromuscular re-ed: Supine hip fallouts BluTB 10x B, 10/10 unilaterally S/L clams BluTB 10/10 Bridge against BluTB 10x Therapeutic Activity: STS from Airex pad 10x arms crossed OH press with 5# KB Runners step 4 in step 10/10 Standing hip abduction/extension 2x10 ea BIL Standing alternating marching 3x30 - cues for increased hip flexion  PATIENT EDUCATION:  Education details: Pt received education regarding HEP performance, ADL performance, functional activity tolerance, impairment education, appropriate performance of therapeutic activities. Person educated: Patient Education method: Explanation, Demonstration, Tactile cues, Verbal cues, and Handouts Education comprehension: verbalized understanding and returned  demonstration  HOME EXERCISE PROGRAM: Access Code: [email or web address] URL: [email or web address] Date: 04/01/2024 Prepared by: [name]  Exercises - Sidelying Hip Abduction  - 1 x daily - 4 x weekly - 2-3 sets - 10 reps - 3s hold - Sidelying Hip Adduction  - 1 x daily - 4 x weekly -  2-3 sets - 10 reps - 3s hold - Supine Bridge with Resistance Band  - 1 x daily - 7 x weekly - 2-3 sets - 12 reps - 5s hold - Seated Hip Internal Rotation AROM  - 1-3 x daily - 7 x weekly - 2 sets - 15 reps - 2s hold - Seated Hip External Rotation AROM  - 1-3 x daily - 7 x weekly - 3 sets - 15 reps - 2s hold ---------------------------------------------------------------------------------------------  ASSESSMENT:  CLINICAL IMPRESSION: Pt attended physical therapy session for re-evaluation of R hip pain. Pt has met all goals and is content with current functional level, safe for d/c. Pt required minimal v/t cuing as well as no assistance for safe and appropriate performance of today's activities. Pt is to be d/c at completion of today's session as all therapeutic goals have been met. Education was given to continue applying ADL education from previous sessions as well as performing HEP as prescribed with freedom to progress as tolerated using previous education on modification and exercise dosage. Pt has displayed and verbalized competence regarding this education.     Eval impression (04/01/2024): Pt. attended today's physical therapy session for evaluation of R hip pain. Pt has complaints of 2/10 pain onset since last November, that gets worse after prolonged sitting or with general activity, especially first thing in the morning. Pt has notable deficits with R hip mobility, strength, gait pattern, and imagine reports of mod - severe B hip oa.Treatment performed today focused on pt education detailed in the objective. Pt demonstrated great understanding of education provided. required minimal v/t cues and  no assistance for appropriate performance with today's activities. Pt requires the intervention of skilled outpatient physical therapy to address the aforementioned deficits and progress towards a functional level in line with therapeutic goals.   OBJECTIVE IMPAIRMENTS: Abnormal gait, decreased mobility, difficulty walking, decreased ROM, decreased strength, hypomobility, improper body mechanics, and pain.   ACTIVITY LIMITATIONS: standing, squatting, dressing, and locomotion level  PARTICIPATION LIMITATIONS: community activity, occupation, and yard work  PERSONAL FACTORS: Age, Fitness, and Time since onset of injury/illness/exacerbation are also affecting patient's functional outcome.   REHAB POTENTIAL: Fair severe OA  CLINICAL DECISION MAKING: Stable/uncomplicated  EVALUATION COMPLEXITY: Low   GOALS: Goals reviewed with patient? YES  SHORT TERM GOALS: Target date: 04/22/2024 Pt will be independent with administered HEP to demonstrate the competency necessary for long term managemnet of symptoms at home.  Baseline: Goal status: Met  LONG TERM GOALS: Target date: 05/13/2024  Pt. Will achieve a LEFS score of 68/80 as to demonstrate improvement in self-perceived functional ability with daily activities.  Baseline: 59/80 Goal status: MET 05/13/2024 (LEFS 74/80)  2.  Pt will improve Global Hip/knee strength to a 4+/5 to demonstrate improvement in strength for quality of motion and activity performance.  Baseline:  Goal status: MET 05/13/2024  3.   Pt will independently ambulate 20 minutes with no AD and less than 2/10 pain alongside a normalized gait pattern to demonstrate improved weightbearing tolerance, BLE strength, and functional capacity for community ambulation. Baseline:  Goal status: MET 05/13/2024 (walking for an hour at least while cutting grass)  4.  Pt will report being able to participate in Pickleball to demonstrate return to PLOF with recreational activity Baseline:  hasn't returned Goal status: MET 05/13/2024, was able to play a month ago  --------------------------------------------------------------------------------------------- PLAN:  PT FREQUENCY: 1-2x/week  PT DURATION: 6 weeks  PLANNED INTERVENTIONS: 97110-Therapeutic exercises, 97530- Therapeutic activity, [id number]- Neuromuscular re-education, 97535- Self  Care, [id number]- Manual therapy, [id number]- Gait training, [phone]- Aquatic Therapy, Patient/Family education, Balance training, Stair training, Taping, and Joint mobilization  PLAN FOR NEXT SESSION: d/c   PHYSICAL THERAPY DISCHARGE SUMMARY  Visits from Start of Care: 8  Current functional level related to goals / functional outcomes: See assessment   Remaining deficits: See assessment   Education / Equipment: See assessment   Patient agrees to discharge. Patient goals were met. Patient is being discharged due to meeting the stated rehab goals.   [name], PT, DPT 05/13/2024, 2:34 PM    Date of referral: 02/03/2024 Referring provider: Jerri Kay HERO, MD Referring diagnosis? Primary osteoarthritis of right hip [M16.11]  Treatment diagnosis? (if different than referring diagnosis) Hip weakness, R hip pain, gait deficits  What was this (referring dx) caused by? Arthritis  Nature of Condition: Chronic (continuous duration > 3 months)   Laterality: Rt  Current Functional Measure Score: LEFS 59/80  Objective measurements identify impairments when they are compared to normal values, the uninvolved extremity, and prior level of function.  [x]  Yes  []  No  Objective assessment of functional ability: Minimal functional limitations   Briefly describe symptoms: R hip pain since november  How did symptoms start: no moi  Average pain intensity:  Last 24 hours: 2/10  Past week: 4/10  How often does the pt experience symptoms? Constantly  How much have the symptoms interfered with usual daily activities? Moderately  How has  condition changed since care began at this facility? NA - initial visit  In general, how is the patients overall health? Good   BACK PAIN (STarT Back Screening Tool) No

## 2024-06-24 ENCOUNTER — Ambulatory Visit

## 2024-06-29 ENCOUNTER — Ambulatory Visit

## 2024-06-29 VITALS — Ht 67.0 in | Wt 210.0 lb

## 2024-06-29 DIAGNOSIS — Z0000 Encounter for general adult medical examination without abnormal findings: Secondary | ICD-10-CM

## 2024-06-29 NOTE — Patient Instructions (Signed)
 [name],  Thank you for taking the time for your Medicare Wellness Visit. I appreciate your continued commitment to your health goals. Please review the care plan we discussed, and feel free to reach out if I can assist you further.  Medicare recommends these wellness visits once per year to help you and your care team stay ahead of potential health issues. These visits are designed to focus on prevention, allowing your provider to concentrate on managing your acute and chronic conditions during your regular appointments.  Please note that Annual Wellness Visits do not include a physical exam. Some assessments may be limited, especially if the visit was conducted virtually. If needed, we may recommend a separate in-person follow-up with your provider.  Ongoing Care Seeing your primary care provider every 3 to 6 months helps us  monitor your health and provide consistent, personalized care. Next office visit on 07/09/2024.  You are due for a Flu vaccine, a pneumonia vaccine and a Shingles vaccine.  I have requested a referral for you to see a GYN and also to have a Vit D check during your up coming visit.  Please discuss with provider (as a reminder) during your office visit.  Keep up the good work.    Referrals If a referral was made during today's visit and you haven't received any updates within two weeks, please contact the referred provider directly to check on the status.  Recommended Screenings:  Health Maintenance  Topic Date Due   DTaP/Tdap/Td vaccine (1 - Tdap) Never done   Colon Cancer Screening  Never done   Pneumococcal Vaccine for age over 80 (1 of 1 - PCV) Never done   Zoster (Shingles) Vaccine (1 of 2) Never done   DEXA scan (bone density measurement)  Never done   Flu Shot  Never done   COVID-19 Vaccine (1 - 2025-26 season) Never done   Medicare Annual Wellness Visit  06/29/2025   Breast Cancer Screening  12/25/2025   Cologuard (Stool DNA test)  12/24/2026    Hepatitis C Screening  Completed   Meningitis B Vaccine  Aged Out       06/29/2024    3:23 PM  Advanced Directives  Does Patient Have a Medical Advance Directive? No   Advance Care Planning is important because it: Ensures you receive medical care that aligns with your values, goals, and preferences. Provides guidance to your family and loved ones, reducing the emotional burden of decision-making during critical moments.  Vision: Annual vision screenings are recommended for early detection of glaucoma, cataracts, and diabetic retinopathy. These exams can also reveal signs of chronic conditions such as diabetes and high blood pressure.  Dental: Annual dental screenings help detect early signs of oral cancer, gum disease, and other conditions linked to overall health, including heart disease and diabetes.  Please see the attached documents for additional preventive care recommendations.

## 2024-06-29 NOTE — Progress Notes (Signed)
 Subjective:   [name] is a 68 y.o. who presents for a Medicare Wellness preventive visit.  As a reminder, Annual Wellness Visits don't include a physical exam, and some assessments may be limited, especially if this visit is performed virtually. We may recommend an in-person follow-up visit with your provider if needed.  Visit Complete: Virtual I connected with  [name] on 06/29/24 by a video and audio enabled telemedicine application and verified that I am speaking with the correct person using two identifiers.  Patient Location: Home  Provider Location: Home Office  I discussed the limitations of evaluation and management by telemedicine. The patient expressed understanding and agreed to proceed.  Vital Signs: Because this visit was a virtual/telehealth visit, some criteria may be missing or patient reported. Any vitals not documented were not able to be obtained and vitals that have been documented are patient reported.  Persons Participating in Visit: Patient.  AWV Questionnaire: No: Patient Medicare AWV questionnaire was not completed prior to this visit.  Cardiac Risk Factors include: advanced age (>59men, >54 women);obesity (BMI >30kg/m2)     Objective:    Today's Vitals   06/29/24 1508  Weight: 210 lb (95.3 kg)  Height: 5' 7 (1.702 m)  PainSc: 4    Body mass index is 32.89 kg/m.     06/29/2024    3:23 PM 04/01/2024    5:48 PM 09/10/2011    1:59 PM  Advanced Directives  Does Patient Have a Medical Advance Directive? No No Patient does not have advance directive   Would patient like information on creating a medical advance directive?  No - Patient declined      Data saved with a previous flowsheet row definition    Current Medications (verified) Outpatient Encounter Medications as of 06/29/2024  Medication Sig   cholecalciferol (VITAMIN D3) 25 MCG (1000 UNIT) tablet Take by mouth. (Patient taking differently: Take 1,000 Units by  mouth once a week.)   Multiple Vitamin (MULITIVITAMIN WITH MINERALS) TABS Take 1 tablet by mouth daily.     amoxicillin  (AMOXIL ) 500 MG capsule Take 2 capsules (1,000 mg total) by mouth 3 (three) times daily. (Patient not taking: Reported on 06/29/2024)   celecoxib  (CELEBREX ) 200 MG capsule Take 1 capsule (200 mg total) by mouth 2 (two) times daily. (Patient not taking: Reported on 06/29/2024)   No facility-administered encounter medications on file as of 06/29/2024.    Allergies (verified) Patient has no known allergies.   History: Past Medical History:  Diagnosis Date   Anemia    GERD (gastroesophageal reflux disease)    no meds   Past Surgical History:  Procedure Laterality Date   DILATION AND CURETTAGE OF UTERUS     MYOMECTOMY     History reviewed. No pertinent family history. Social History   Socioeconomic History   Marital status: Single    Spouse name: Not on file   Number of children: Not on file   Years of education: Not on file   Highest education level: Not on file  Occupational History   Occupation: unemployed  Tobacco Use   Smoking status: Never   Smokeless tobacco: Never  Vaping Use   Vaping status: Never Used  Substance and Sexual Activity   Alcohol use: Yes    Comment: occasionally   Drug use: No   Sexual activity: Not Currently  Other Topics Concern   Not on file  Social History Narrative   Lives alone/2025   Social Drivers of Health  Financial Resource Strain: Low Risk  (06/29/2024)   Overall Financial Resource Strain (CARDIA)    Difficulty of Paying Living Expenses: Not very hard  Food Insecurity: No Food Insecurity (06/29/2024)   Hunger Vital Sign    Worried About Running Out of Food in the Last Year: Never true    Ran Out of Food in the Last Year: Never true  Transportation Needs: No Transportation Needs (06/29/2024)   PRAPARE - Administrator, Civil Service (Medical): No    Lack of Transportation (Non-Medical): No   Physical Activity: Sufficiently Active (06/29/2024)   Exercise Vital Sign    Days of Exercise per Week: 4 days    Minutes of Exercise per Session: 60 min  Stress: No Stress Concern Present (06/29/2024)   [employer] of Occupational Health - Occupational Stress Questionnaire    Feeling of Stress: Not at all  Social Connections: Moderately Integrated (06/29/2024)   Social Connection and Isolation Panel    Frequency of Communication with Friends and Family: More than three times a week    Frequency of Social Gatherings with Friends and Family: More than three times a week    Attends Religious Services: More than 4 times per year    Active Member of [employer] or Organizations: Yes    Attends Engineer, Structural: More than 4 times per year    Marital Status: Never married    Tobacco Counseling Counseling given: Not Answered    Clinical Intake:  Pre-visit preparation completed: Yes  Pain : 0-10 Pain Score: 4  Pain Type: Acute pain Pain Location: Shoulder Pain Orientation: Right Pain Descriptors / Indicators: Aching, Discomfort Pain Onset: More than a month ago Pain Frequency: Intermittent     BMI - recorded: 32.89 Nutritional Status: BMI > 30  Obese Nutritional Risks: None Diabetes: No  Lab Results  Component Value Date   HGBA1C 6.0 (H) 12/10/2023     How often do you need to have someone help you when you read instructions, pamphlets, or other written materials from your doctor or pharmacy?: 1 - Never  Interpreter Needed?: No  Information entered by :: Jayonna Meyering, RMA   Activities of Daily Living     06/29/2024    3:09 PM  In your present state of health, do you have any difficulty performing the following activities:  Hearing? 0  Vision? 0  Difficulty concentrating or making decisions? 0  Walking or climbing stairs? 0  Dressing or bathing? 0  Doing errands, shopping? 0  Preparing Food and eating ? N  Using the Toilet? N  In the past six  months, have you accidently leaked urine? N  Do you have problems with loss of bowel control? N  Managing your Medications? N  Managing your Finances? N  Housekeeping or managing your Housekeeping? N    Patient Care Team: Tanda Bleacher, MD as PCP - General [facility] Medicine) [facility] [place], [place], [place]  I have updated your Care Teams any recent Medical Services you may have received from other providers in the past year.     Assessment:   This is a routine wellness examination for [name].  Hearing/Vision screen Hearing Screening - Comments:: Denies hearing difficulties   Vision Screening - Comments:: Wears eyeglasses/ Dr. Geroge   Goals Addressed             This Visit's Progress    Patient Stated       To lose weight/2025  Depression Screen     06/29/2024    3:33 PM 01/07/2024    9:28 AM 12/10/2023    9:50 AM  PHQ 2/9 Scores  PHQ - 2 Score 0 0 0  PHQ- 9 Score 0      Fall Risk     06/29/2024    3:24 PM 01/07/2024    9:25 AM 12/10/2023    9:46 AM  Fall Risk   Falls in the past year? 1 0 0  Number falls in past yr: 0 0 0  Injury with Fall? 1 0 0  Comment fracture shoulder    Risk for fall due to :   No Fall Risks  Follow up Falls evaluation completed;Falls prevention discussed  Falls evaluation completed    MEDICARE RISK AT HOME:  Medicare Risk at Home Any stairs in or around the home?: Yes (4 steps from garage/2 story home) If so, are there any without handrails?: No Home free of loose throw rugs in walkways, pet beds, electrical cords, etc?: Yes Adequate lighting in your home to reduce risk of falls?: Yes Life alert?: No Use of a cane, walker or w/c?: No Grab bars in the bathroom?: No Shower chair or bench in shower?: Yes Elevated toilet seat or a handicapped toilet?: Yes  TIMED UP AND GO:  Was the test performed?  No  Cognitive Function: Declined/Normal: No cognitive concerns noted by patient or family. Patient alert, oriented,  able to answer questions appropriately and recall recent events. No signs of memory loss or confusion.        Immunizations  There is no immunization history on file for this patient.  Screening Tests Health Maintenance  Topic Date Due   DTaP/Tdap/Td (1 - Tdap) Never done   Colonoscopy  Never done   Pneumococcal Vaccine: 50+ Years (1 of 1 - PCV) Never done   Zoster Vaccines- Shingrix (1 of 2) Never done   DEXA SCAN  Never done   Influenza Vaccine  Never done   COVID-19 Vaccine (1 - 2025-26 season) Never done   Medicare Annual Wellness (AWV)  06/29/2025   Mammogram  12/25/2025   Fecal DNA (Cologuard)  12/24/2026   Hepatitis C Screening  Completed   Meningococcal B Vaccine  Aged Out    Health Maintenance Items Addressed: Vaccines Due: flu, pneumonia, tdap, and shingrix, Labs Due Vit D check, See Nurse Notes at the end of this note  Additional Screening:  Vision Screening: Recommended annual ophthalmology exams for early detection of glaucoma and other disorders of the eye. Is the patient up to date with their annual eye exam?  Yes  Who is the provider or what is the name of the office in which the patient attends annual eye exams? Dr. Burnett is up to date.  Dental Screening: Recommended annual dental exams for proper oral hygiene  Community Resource Referral / Chronic Care Management: CRR required this visit?  No   CCM required this visit?  No   Plan:    I have personally reviewed and noted the following in the patient's chart:   Medical and social history Use of alcohol, tobacco or illicit drugs  Current medications and supplements including opioid prescriptions. Patient is not currently taking opioid prescriptions. Functional ability and status Nutritional status Physical activity Advanced directives List of other physicians Hospitalizations, surgeries, and ER visits in previous 12 months Vitals Screenings to include cognitive, depression, and  falls Referrals and appointments  In addition, I have reviewed and discussed with  patient certain preventive protocols, quality metrics, and best practice recommendations. A written personalized care plan for preventive services as well as general preventive health recommendations were provided to patient.   Kathe Wirick L Marjory Meints, CMA   06/29/2024   After Visit Summary: (MyChart) Due to this being a telephonic visit, the after visit summary with patients personalized plan was offered to patient via MyChart   Notes: Patient is due for a pneumonia vaccine, a Shingrix vaccine and a flu vaccine.  She is requesting a referral to see a GYN.  Patient would like to get her Vit D check during her up coming office visit.  Patient had no other concerns to discuss today.

## 2024-07-05 ENCOUNTER — Encounter: Payer: Self-pay | Admitting: Radiology

## 2024-07-07 ENCOUNTER — Ambulatory Visit (HOSPITAL_BASED_OUTPATIENT_CLINIC_OR_DEPARTMENT_OTHER)
Admission: RE | Admit: 2024-07-07 | Discharge: 2024-07-07 | Disposition: A | Discharge status: Home / Self Care | Source: Ambulatory Visit | Attending: Family Medicine | Admitting: Family Medicine

## 2024-07-07 DIAGNOSIS — Z780 Asymptomatic menopausal state: Secondary | ICD-10-CM | POA: Insufficient documentation

## 2024-07-07 DIAGNOSIS — Z13820 Encounter for screening for osteoporosis: Secondary | ICD-10-CM | POA: Diagnosis present

## 2024-07-08 ENCOUNTER — Ambulatory Visit: Payer: Self-pay | Admitting: Family Medicine

## 2024-07-09 ENCOUNTER — Ambulatory Visit (INDEPENDENT_AMBULATORY_CARE_PROVIDER_SITE_OTHER): Admitting: Family Medicine

## 2024-07-09 ENCOUNTER — Encounter: Payer: Self-pay | Admitting: Family Medicine

## 2024-07-09 VITALS — BP 132/82 | HR 62 | Ht 67.0 in | Wt 212.4 lb

## 2024-07-09 DIAGNOSIS — M25511 Pain in right shoulder: Secondary | ICD-10-CM

## 2024-07-09 DIAGNOSIS — G8929 Other chronic pain: Secondary | ICD-10-CM | POA: Diagnosis not present

## 2024-07-09 DIAGNOSIS — D219 Benign neoplasm of connective and other soft tissue, unspecified: Secondary | ICD-10-CM | POA: Diagnosis not present

## 2024-07-09 NOTE — Progress Notes (Signed)
 Established Patient Office Visit  Subjective    Patient ID: [name], female    DOB: November 19, 1955  Age: 68 y.o. MRN: [id number]  CC:  Chief Complaint  Patient presents with   Medical Management of Chronic Issues    Pt reports she fell on her arm playing pickle ball and she would like to get that checked out. Pt reports sometimes when walks for extended amounts of time she gets pain in her groin area.     HPI [name] presents with complaint of right arm pain. She reports that she 4-6 months ago she fell on her arm while playing pickle ball. She felt that it would improve but has not. She also reports that she needs follow up for uterine fibroids. She has had intermittent abdominal discomfort.   Outpatient Encounter Medications as of 07/09/2024  Medication Sig   Multiple Vitamin (MULITIVITAMIN WITH MINERALS) TABS Take 1 tablet by mouth daily.     amoxicillin  (AMOXIL ) 500 MG capsule Take 2 capsules (1,000 mg total) by mouth 3 (three) times daily. (Patient not taking: Reported on 06/29/2024)   celecoxib  (CELEBREX ) 200 MG capsule Take 1 capsule (200 mg total) by mouth 2 (two) times daily. (Patient not taking: Reported on 06/29/2024)   cholecalciferol (VITAMIN D3) 25 MCG (1000 UNIT) tablet Take by mouth. (Patient taking differently: Take 1,000 Units by mouth once a week.)   No facility-administered encounter medications on file as of 07/09/2024.    Past Medical History:  Diagnosis Date   Anemia    GERD (gastroesophageal reflux disease)    no meds    Past Surgical History:  Procedure Laterality Date   DILATION AND CURETTAGE OF UTERUS     MYOMECTOMY      History reviewed. No pertinent family history.  Social History   Socioeconomic History   Marital status: Single    Spouse name: Not on file   Number of children: Not on file   Years of education: Not on file   Highest education level: Not on file  Occupational History   Occupation: unemployed   Tobacco Use   Smoking status: Never   Smokeless tobacco: Never  Vaping Use   Vaping status: Never Used  Substance and Sexual Activity   Alcohol use: Yes    Comment: occasionally   Drug use: No   Sexual activity: Not Currently  Other Topics Concern   Not on file  Social History Narrative   Lives alone/2025   Social Drivers of Health   Financial Resource Strain: Low Risk  (06/29/2024)   Overall Financial Resource Strain (CARDIA)    Difficulty of Paying Living Expenses: Not very hard  Food Insecurity: No Food Insecurity (06/29/2024)   Hunger Vital Sign    Worried About Running Out of Food in the Last Year: Never true    Ran Out of Food in the Last Year: Never true  Transportation Needs: No Transportation Needs (06/29/2024)   PRAPARE - Administrator, Civil Service (Medical): No    Lack of Transportation (Non-Medical): No  Physical Activity: Sufficiently Active (06/29/2024)   Exercise Vital Sign    Days of Exercise per Week: 4 days    Minutes of Exercise per Session: 60 min  Stress: No Stress Concern Present (06/29/2024)   [employer] of Occupational Health - Occupational Stress Questionnaire    Feeling of Stress: Not at all  Social Connections: Moderately Integrated (06/29/2024)   Social Connection and Isolation Panel  Frequency of Communication with Friends and Family: More than three times a week    Frequency of Social Gatherings with Friends and Family: More than three times a week    Attends Religious Services: More than 4 times per year    Active Member of [employer] or Organizations: Yes    Attends Banker Meetings: More than 4 times per year    Marital Status: Never married  Intimate Partner Violence: Patient Unable To Answer (06/29/2024)   Humiliation, Afraid, Rape, and Kick questionnaire    Fear of Current or Ex-Partner: Patient unable to answer    Emotionally Abused: Patient unable to answer    Physically Abused: Patient unable to  answer    Sexually Abused: Patient unable to answer    Review of Systems  All other systems reviewed and are negative.       Objective    BP 132/82   Pulse 62   Ht 5' 7 (1.702 m)   Wt 212 lb 6.4 oz (96.3 kg)   SpO2 97%   BMI 33.27 kg/m   Physical Exam Vitals and nursing note reviewed.  Constitutional:      General: She is not in acute distress. Cardiovascular:     Rate and Rhythm: Normal rate and regular rhythm.  Pulmonary:     Effort: Pulmonary effort is normal.     Breath sounds: Normal breath sounds.  Abdominal:     Palpations: Abdomen is soft.     Tenderness: There is no abdominal tenderness.  Musculoskeletal:     Right shoulder: Tenderness present. No deformity. Decreased range of motion.  Neurological:     General: No focal deficit present.     Mental Status: She is alert and oriented to person, place, and time.         Assessment & Plan:   Chronic right shoulder pain -     Ambulatory referral to Orthopedic Surgery  Fibroids -     Ambulatory referral to Obstetrics / Gynecology     Return in about 6 months (around 01/06/2025) for follow up, chronic med issues.   Tanda Raguel SQUIBB, MD

## 2024-07-13 ENCOUNTER — Encounter: Payer: Self-pay | Admitting: Family Medicine

## 2024-07-19 ENCOUNTER — Telehealth: Payer: Self-pay | Admitting: Family Medicine

## 2024-07-19 NOTE — Telephone Encounter (Signed)
 Copied from CRM #[id number]. Topic: General - Other >> Jul 19, 2024  3:58 PM Hadassah PARAS wrote: Reason for CRM: Tristian from [facility] is not in network with [employer]. This office does not accept medicaid overall.

## 2024-07-20 ENCOUNTER — Other Ambulatory Visit (INDEPENDENT_AMBULATORY_CARE_PROVIDER_SITE_OTHER)

## 2024-07-20 ENCOUNTER — Ambulatory Visit: Admitting: Orthopaedic Surgery

## 2024-07-20 DIAGNOSIS — G8929 Other chronic pain: Secondary | ICD-10-CM

## 2024-07-20 DIAGNOSIS — M25511 Pain in right shoulder: Secondary | ICD-10-CM

## 2024-07-20 NOTE — Telephone Encounter (Signed)
 Noted

## 2024-07-20 NOTE — Progress Notes (Signed)
 Office Visit Note   Patient: [name]           Date of Birth: 1956/08/30           MRN: [id number] Visit Date: 07/20/2024              Requested by: Tanda Bleacher, MD [address] [place],  [place] [address] PCP: Tanda Bleacher, MD   Assessment & Plan: Visit Diagnoses:  1. Chronic right shoulder pain     Plan: History of Present Illness [name] is a 68 year old female who presents with shoulder pain after a fall while playing pickleball.  She fell while reaching for a shot, landing directly on her shoulder, which caused immediate pain and limited her ability to raise her arm. The pain is excruciating, especially when stretching her arm. She uses Tylenol  for pain management, but her range of motion remains limited, and she can only raise her arm to a certain height before experiencing significant pain.  Physical Exam MUSCULOSKELETAL: Decreased range of motion, weakness, and pain in shoulder motion.  Significant weakness in SS and IS to manual testing.  Results RADIOLOGY Shoulder X-ray: No fracture or dislocation.  Assessment and Plan Suspected right rotator cuff tear with secondary adhesive capsulitis (frozen shoulder) Suspected rotator cuff tear with secondary adhesive capsulitis due to weakness and pain during specific movements. MRI necessary to confirm tear. - Ordered MRI of the right shoulder. - Advised caution with shoulder movements until MRI results are available.  Follow-Up Instructions: No follow-ups on file.   Orders:  Orders Placed This Encounter  Procedures   XR Shoulder Right   MR SHOULDER RIGHT WO CONTRAST   No orders of the defined types were placed in this encounter.     Procedures: No procedures performed   Clinical Data: No additional findings.   Subjective: Chief Complaint  Patient presents with   Right Shoulder - Pain    HPI  Review of Systems  Constitutional: Negative.   HENT: Negative.     Eyes: Negative.   Respiratory: Negative.    Cardiovascular: Negative.   Endocrine: Negative.   Musculoskeletal: Negative.   Neurological: Negative.   Hematological: Negative.   Psychiatric/Behavioral: Negative.    All other systems reviewed and are negative.    Objective: Vital Signs: There were no vitals taken for this visit.  Physical Exam Vitals and nursing note reviewed.  Constitutional:      Appearance: She is well-developed.  HENT:     Head: Atraumatic.     Nose: Nose normal.  Eyes:     Extraocular Movements: Extraocular movements intact.  Cardiovascular:     Pulses: Normal pulses.  Pulmonary:     Effort: Pulmonary effort is normal.  Abdominal:     Palpations: Abdomen is soft.  Musculoskeletal:     Cervical back: Neck supple.  Skin:    General: Skin is warm.     Capillary Refill: Capillary refill takes less than 2 seconds.  Neurological:     Mental Status: She is alert. Mental status is at baseline.  Psychiatric:        Behavior: Behavior normal.        Thought Content: Thought content normal.        Judgment: Judgment normal.     Ortho Exam  Specialty Comments:  No specialty comments available.  Imaging: XR Shoulder Right Result Date: 07/20/2024 3 view xrays show no acute or structural abnormalities    PMFS History:  There are no active problems to display for this patient.  Past Medical History:  Diagnosis Date   Anemia    GERD (gastroesophageal reflux disease)    no meds    No family history on file.  Past Surgical History:  Procedure Laterality Date   DILATION AND CURETTAGE OF UTERUS     MYOMECTOMY     Social History   Occupational History   Occupation: unemployed  Tobacco Use   Smoking status: Never   Smokeless tobacco: Never  Vaping Use   Vaping status: Never Used  Substance and Sexual Activity   Alcohol use: Yes    Comment: occasionally   Drug use: No   Sexual activity: Not Currently

## 2024-08-04 ENCOUNTER — Ambulatory Visit
Admission: RE | Admit: 2024-08-04 | Discharge: 2024-08-04 | Disposition: A | Source: Ambulatory Visit | Attending: Orthopaedic Surgery | Admitting: Orthopaedic Surgery

## 2024-08-04 DIAGNOSIS — G8929 Other chronic pain: Secondary | ICD-10-CM

## 2024-08-10 ENCOUNTER — Ambulatory Visit: Admitting: Sports Medicine

## 2024-08-10 ENCOUNTER — Other Ambulatory Visit: Payer: Self-pay

## 2024-08-10 ENCOUNTER — Ambulatory Visit: Admitting: Orthopaedic Surgery

## 2024-08-10 ENCOUNTER — Encounter: Payer: Self-pay | Admitting: Sports Medicine

## 2024-08-10 DIAGNOSIS — M7501 Adhesive capsulitis of right shoulder: Secondary | ICD-10-CM

## 2024-08-10 DIAGNOSIS — G8929 Other chronic pain: Secondary | ICD-10-CM

## 2024-08-10 MED ORDER — BUPIVACAINE HCL 0.25 % IJ SOLN
2.0000 mL | INTRAMUSCULAR | Status: AC | PRN
  Administered 2024-08-10: 2 mL via INTRA_ARTICULAR

## 2024-08-10 MED ORDER — METHYLPREDNISOLONE ACETATE 40 MG/ML IJ SUSP
80.0000 mg | INTRAMUSCULAR | Status: AC | PRN
  Administered 2024-08-10: 80 mg via INTRA_ARTICULAR

## 2024-08-10 MED ORDER — LIDOCAINE HCL 1 % IJ SOLN
2.0000 mL | INTRAMUSCULAR | Status: AC | PRN
  Administered 2024-08-10: 2 mL

## 2024-08-10 NOTE — Progress Notes (Signed)
   Procedure Note  Patient: [name]             Date of Birth: [birth year]           MRN: [id number]             Visit Date: 08/10/2024  Procedures: Visit Diagnoses:  1. Adhesive capsulitis of right shoulder   2. Chronic right shoulder pain    Lab Results  Component Value Date   HGBA1C 6.0 (H) 12/10/2023   Large Joint Inj: R glenohumeral on 08/10/2024 1:47 PM Indications: pain Details: 22 G 3.5 in needle, ultrasound-guided posterior approach Medications: 2 mL lidocaine  1 %; 2 mL bupivacaine  0.25 %; 80 mg methylPREDNISolone  acetate 40 MG/ML Outcome: tolerated well, no immediate complications  US -guided glenohumeral joint injection, right shoulder After discussion on risks/benefits/indications, informed verbal consent was obtained. A timeout was then performed. The patient was positioned lying lateral recumbent on examination table. The patient's shoulder was prepped with betadine and multiple alcohol swabs and utilizing ultrasound guidance, the patient's glenohumeral joint was identified on ultrasound. Using ultrasound guidance a 22-gauge, 3.5 inch needle with a mixture of 2:2:2 cc's lidocaine :bupivicaine:depomedrol was directed from a lateral to medial direction via in-plane technique into the glenohumeral joint with visualization of appropriate spread of injectate into the joint. Patient tolerated the procedure well without immediate complications.      Procedure, treatment alternatives, risks and benefits explained, specific risks discussed. Consent was given by the patient. Immediately prior to procedure a time out was called to verify the correct patient, procedure, equipment, support staff and site/side marked as required. Patient was prepped and draped in the usual sterile fashion.     Shoulder (R) ROM is as follows: - Flexion: 125 degrees - Abduction: 90 degrees - External rotation: 50 degrees - Internal rotation: Thumb to SI/TP L5   - She will follow-up with  me in the next 2-3 weeks for reevaluation - We will recheck her range of motion and her function, could consider second additional high-volume glenohumeral joint injection if not significantly improved - Sinew through formalized physical therapy and HEP, we will ultimately get her back to Dr. OLEGARIO Lonell Sprang, DO Primary Care Sports Medicine Physician  [facility] - Orthoped[facility]  This note was dictated using Dragon naturally speaking software and may contain errors in syntax, spelling, or content which have not been identified prior to signing this note.

## 2024-08-10 NOTE — Progress Notes (Signed)
 Office Visit Note   Patient: [name]           Date of Birth: 1955/10/11           MRN: [id number] Visit Date: 08/10/2024              Requested by: Tanda Bleacher, MD [address] [place],  [place] [address] PCP: Tanda Bleacher, MD   Assessment & Plan: Visit Diagnoses:  1. Chronic right shoulder pain     Plan: History of Present Illness [name] is a 68 year old female who presents for MRI review.  She has persistent shoulder pain that is worse at night, often radiates down her arm, and disrupts her sleep. She describes it as similar to a sprained ankle. The pain limits her daily activities, and she is worried it will worsen over time. She has not had prior treatments such as cortisone injections and denies strenuous activity that might have caused it.  Physical Exam MUSCULOSKELETAL: Decreased ROM with pain.  Cuff exam is unchanged from prior visit.  Pain with Hawkin's and Neer signs.    Results RADIOLOGY Shoulder MRI: Degenerative tear of the rotator cuff, degeneration of the supraspinatus and infraspinatus, evidence of adhesive capsulitis.  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 Surgery not recommended due to degenerative nature and risk of worsening adhesive capsulitis. - Administered cortisone injection to alleviate pain and facilitate physical therapy. - Initiated physical therapy to strengthen surrounding muscles and improve flexibility.  Adhesive capsulitis of right shoulder Adhesive capsulitis causing stiffness and limited range of motion. Surgery not recommended due to risk of exacerbating inflammation and stiffness. - Administered cortisone injection to reduce inflammation and pain. - Initiated physical therapy to improve flexibility and range of motion. - Advised against  surgery at this time due to risk of worsening adhesive capsulitis.  Follow-Up Instructions: No follow-ups on file.   Orders:  Orders Placed This Encounter  Procedures   Ambulatory referral to Physical Therapy   No orders of the defined types were placed in this encounter.     Procedures: No procedures performed   Clinical Data: No additional findings.   Subjective: Chief Complaint  Patient presents with   Right Shoulder - Follow-up    MRI review    HPI  Review of Systems  Constitutional: Negative.   HENT: Negative.    Eyes: Negative.   Respiratory: Negative.    Cardiovascular: Negative.   Endocrine: Negative.   Musculoskeletal: Negative.   Neurological: Negative.   Hematological: Negative.   Psychiatric/Behavioral: Negative.    All other systems reviewed and are negative.    Objective: Vital Signs: There were no vitals taken for this visit.  Physical Exam Vitals and nursing note reviewed.  Constitutional:      Appearance: She is well-developed.  HENT:     Head: Atraumatic.     Nose: Nose normal.  Eyes:     Extraocular Movements: Extraocular movements intact.  Cardiovascular:     Pulses: Normal pulses.  Pulmonary:     Effort: Pulmonary effort is normal.  Abdominal:     Palpations: Abdomen is soft.  Musculoskeletal:     Cervical back: Neck supple.  Skin:    General: Skin is warm.     Capillary Refill: Capillary refill takes less than 2 seconds.  Neurological:     Mental Status: She is alert.  Mental status is at baseline.  Psychiatric:        Behavior: Behavior normal.        Thought Content: Thought content normal.        Judgment: Judgment normal.     Ortho Exam  Specialty Comments:  No specialty comments available.  Imaging: No results found.   PMFS History: There are no active problems to display for this patient.  Past Medical History:  Diagnosis Date   Anemia    GERD (gastroesophageal reflux disease)    no meds    No  family history on file.  Past Surgical History:  Procedure Laterality Date   DILATION AND CURETTAGE OF UTERUS     MYOMECTOMY     Social History   Occupational History   Occupation: unemployed  Tobacco Use   Smoking status: Never   Smokeless tobacco: Never  Vaping Use   Vaping status: Never Used  Substance and Sexual Activity   Alcohol use: Yes    Comment: occasionally   Drug use: No   Sexual activity: Not Currently

## 2024-09-03 ENCOUNTER — Encounter: Payer: Self-pay | Admitting: Sports Medicine

## 2024-09-03 ENCOUNTER — Ambulatory Visit: Admitting: Sports Medicine

## 2024-09-03 DIAGNOSIS — G8929 Other chronic pain: Secondary | ICD-10-CM | POA: Diagnosis not present

## 2024-09-03 DIAGNOSIS — M25511 Pain in right shoulder: Secondary | ICD-10-CM

## 2024-09-03 DIAGNOSIS — M7501 Adhesive capsulitis of right shoulder: Secondary | ICD-10-CM

## 2024-09-03 NOTE — Progress Notes (Signed)
 "  [name] - 69 y.o. female MRN [id number]  Date of birth: 02-16-1956  Office Visit Note: Visit Date: 09/03/2024 PCP: Tanda Bleacher, MD Referred by: Tanda Bleacher, MD  Subjective: Chief Complaint  Patient presents with   Right Shoulder - Follow-up   HPI: [name] is a pleasant 69 y.o. female who presents today for follow-up of chronic right shoulder pain with adhesive capsulitis.  Clo reports that she is doing much better since her last visit. She has not been to physical therapy yet as they were scheduling out into January. She does have her initial visit with them next week. Patient says that she no longer has pain down the arm, and her motion is better although not 100%. She is asking whether she needs another injection today, or if physical therapy will help her enough to gain that motion back.   Pertinent ROS were reviewed with the patient and found to be negative unless otherwise specified above in HPI.   Assessment & Plan: Visit Diagnoses:  1. Adhesive capsulitis of right shoulder   2. Chronic right shoulder pain    Plan: Impression is chronic right shoulder pain with adhesive capsulitis that responded very well to ultrasound-guided intra-articular injection.  She still has some limitation specifically with internal rotation, although she has had marked active and passive range of motion following the injection.  At this point, she is over 80% improved from both a pain and range of motion perspective.  I would like her to progress through formalized physical therapy to work on regaining full range of motion and then stabilization of the shoulder joint.  She will follow-up with Dr. Jerri in about 5-6 weeks for reevaluation.  We discussed role for additional GHJ injection if and only if she does not continue to make great improvements, we will defer this to Dr. Jerri at her follow-up.  I am happy to see her back as needed.  Okay for OTC medication only as  needed.  Follow-up: Return in about 5 weeks (around 10/08/2024) for Make F/u with Dr. Jerri for R-shoulder .   Meds & Orders: No orders of the defined types were placed in this encounter.  No orders of the defined types were placed in this encounter.    Procedures: No procedures performed      Clinical History: No specialty comments available.  She reports that she has never smoked. She has never used smokeless tobacco.  Recent Labs    12/10/23 1048  HGBA1C 6.0*    Objective:    Physical Exam  Gen: Well-appearing, in no acute distress; non-toxic CV: Well-perfused. Warm.  Resp: Breathing unlabored on room air; no wheezing. Psych: Fluid speech in conversation; appropriate affect; normal thought process  Ortho Exam - Right shoulder: No redness swelling or effusion of the shoulder joint.  There is markedly improved active and passive range of motion, as follows from last visit to today's evaluation:  Shoulder (R) ROM is as follows: - Flexion: 125 degrees --> 175 degrees - Abduction: 90 degrees --> 170 degrees - External rotation: 50 degrees --> 55 (equivocal cont) - Internal rotation: Thumb to SI joint --> Thumb to L4  Imaging: No results found.  Past Medical/Family/Surgical/Social History: Medications & Allergies reviewed per EMR, new medications updated. There are no active problems to display for this patient.  Past Medical History:  Diagnosis Date   Anemia    GERD (gastroesophageal reflux disease)    no meds   History reviewed. No  pertinent family history. Past Surgical History:  Procedure Laterality Date   DILATION AND CURETTAGE OF UTERUS     MYOMECTOMY     Social History   Occupational History   Occupation: unemployed  Tobacco Use   Smoking status: Never   Smokeless tobacco: Never  Vaping Use   Vaping status: Never Used  Substance and Sexual Activity   Alcohol use: Yes    Comment: occasionally   Drug use: No   Sexual activity: Not Currently   "

## 2024-09-03 NOTE — Progress Notes (Signed)
 Patient says that she is doing much better since her last visit. She has not been to physical therapy yet as they were scheduling out into January. She does have her initial visit with them next week. Patient says that she no longer has pain down the arm, and her motion is better although not 100%. She is asking whether she needs another injection today, or if physical therapy will help her enough to gain that motion back.

## 2024-09-07 ENCOUNTER — Other Ambulatory Visit: Payer: Self-pay

## 2024-09-07 ENCOUNTER — Encounter: Payer: Self-pay | Admitting: Physical Therapy

## 2024-09-07 ENCOUNTER — Ambulatory Visit: Attending: Orthopaedic Surgery | Admitting: Physical Therapy

## 2024-09-07 DIAGNOSIS — M25511 Pain in right shoulder: Secondary | ICD-10-CM | POA: Diagnosis present

## 2024-09-07 DIAGNOSIS — M6281 Muscle weakness (generalized): Secondary | ICD-10-CM | POA: Diagnosis present

## 2024-09-07 DIAGNOSIS — G8929 Other chronic pain: Secondary | ICD-10-CM | POA: Diagnosis present

## 2024-09-07 NOTE — Therapy (Signed)
 " OUTPATIENT PHYSICAL THERAPY UPPER EXTREMITY EVALUATION   Patient Name: [name] MRN: [id number] DOB:1955/10/21, 69 y.o., female Today's Date: 09/07/2024  END OF SESSION:  PT End of Session - 09/07/24 1435     Visit Number 1    Number of Visits 12    Date for Recertification  11/09/24    Authorization - Visit Number 1    Progress Note Due on Visit 10    PT Start Time 1440    PT Stop Time 1525    PT Time Calculation (min) 45 min    Activity Tolerance Patient tolerated treatment well    Behavior During Therapy WFL for tasks assessed/performed          Past Medical History:  Diagnosis Date   Anemia    GERD (gastroesophageal reflux disease)    no meds   Past Surgical History:  Procedure Laterality Date   DILATION AND CURETTAGE OF UTERUS     MYOMECTOMY     There are no active problems to display for this patient.   PCP: Raguel Blush, MD  REFERRING PROVIDER: Kay Cummins, MD  REFERRING DIAG:  [phone] (ICD-10-CM) - Chronic right shoulder pain    THERAPY DIAG:  Chronic right shoulder pain  Muscle weakness (generalized)  Rationale for Evaluation and Treatment: Rehabilitation  ONSET DATE: Sept 2025  SUBJECTIVE:                                                                                                                                                                                      SUBJECTIVE STATEMENT: Pt states that she was playing pickleball in Sept and swung hard, fell, and landed on R shoulder. Pt saw MD and began NSAIDs regimen. At PCP visit in October, symptoms extended beyond inflammatory phase. Pt had Imaging completed November via radiographs. MRI completed beginning Dec 2025. 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 Pt had cortisone injection 08/10/24 with improvement noted. Pt states that her symptoms range in intensity from 0/10-8/10 depending on activity or position. Pt reports symptoms inc with overhead reaching, pain with ipsilateral side  lying. Improves with rest.    Hand dominance: Right  PERTINENT HISTORY: Cortisone injection helped   PAIN:  Are you having pain? Yes: NPRS scale: 0/10 Pain location: RUE to the wrist  Pain description: shooting, sharp, ache Aggravating factors: overhead reaching Relieving factors: resting  PRECAUTIONS: None  RED FLAGS: None   WEIGHT BEARING RESTRICTIONS: No  FALLS:  Has patient fallen in last 6 months? Yes. Number of falls 1  LIVING ENVIRONMENT: Lives with: lives alone Lives in: House/apartment  OCCUPATION: Caregiver, mortgages   PLOF: Independent  PATIENT GOALS: improve overhead reaching and  ROM, dec pain   NEXT MD VISIT: 10/12/24  OBJECTIVE:  Note: Objective measures were completed at Evaluation unless otherwise noted.  DIAGNOSTIC FINDINGS:  MR R Shoulder IMPRESSION: 08/04/24 1. Significant rotator cuff tendinopathy/tendinosis mainly involving the supraspinatus and infraspinatus tendons. Fairly extensive interstitial tearing along with the bursal surface tear involving the footprint attachment fibers of the supraspinatus tendon with laminar retraction estimated at 16 mm. 2. Intact long head biceps tendon and glenoid labrum. 3. Moderate thickening of the capsular structures in the axillary recess suggesting synovitis or adhesive capsulitis. 4. Moderate AC joint degenerative changes and type 2-3 acromion with mild subacromial spurring changes. 5. Mild/moderate subacromial/subdeltoid bursitis.  PATIENT SURVEYS :  Quick Dash:  QUICK DASH  Please rate your ability do the following activities in the last week by selecting the number below the appropriate response.   Activities Rating  Open a tight or new jar.  2 = Mild difficulty  Do heavy household chores (e.g., wash walls, floors). 2 = Mild difficulty  Carry a shopping bag or briefcase 1 = No difficulty   Wash your back. 3 = Moderate difficulty  Use a knife to cut food. 1 = No difficulty   Recreational  activities in which you take some force or impact through your arm, shoulder or hand (e.g., golf, hammering, tennis, etc.). 3 = Moderate difficulty  During the past week, to what extent has your arm, shoulder or hand problem interfered with your normal social activities with family, friends, neighbors or groups?  2 = Slightly  During the past week, were you limited in your work or other regular daily activities as a result of your arm, shoulder or hand problem? 2 = Slightly limited  Rate the severity of the following symptoms in the last week: Arm, Shoulder, or hand pain. 3 = Moderate  Rate the severity of the following symptoms in the last week: Tingling (pins and needles) in your arm, shoulder or hand. 2 = Mild  During the past week, how much difficulty have you had sleeping because of the pain in your arm, shoulder or hand?  3 = Moderate difficulty   (A QuickDASH score may not be calculated if there is greater than 1 missing item.)  Quick Dash Disability/Symptom Score: [(sum of 24 (n) responses/11 (n)-1] x 25 = 29.5  Minimally Clinically Important Difference (MCID): 15-20 points  (Franchignoni, F. et al. (2013). Minimally clinically important difference of the disabilities of the arm, shoulder, and hand outcome measures (DASH) and its shortened version (Quick DASH). Journal of Orthopaedic & Sports Physical Therapy, 44(1), 30-39)   COGNITION: Overall cognitive status: Within functional limits for tasks assessed     SENSATION: Light touch: WFL Reports burning sensation to the wrist with AROM abd   POSTURE: Rounded shoulders, forward head  UPPER EXTREMITY MMT:   SHOULDER ROM Right eval Left eval  Shoulder flexion 4/5 P   Shoulder extension 4+/5   Shoulder abduction 4-/5 P   Shoulder adduction 4+/5   Shoulder internal rotation 4+/5 P   Shoulder external rotation 4-/5 P   Elbow flexion    Elbow extension    Wrist flexion    Wrist extension    Wrist ulnar deviation    Wrist  radial deviation    Wrist pronation    Wrist supination    (Blank rows = not tested)  UPPER EXTREMITY ROM: PROM flexion 145 RUE, PROM ABD RUE 90  AROM Right eval Left eval  Shoulder flexion 122 WNL  Shoulder  extension 45 WNL   Shoulder abduction 90 WNL  Shoulder adduction WNL WNL  Shoulder functional internal rotation Sacrum  T5  Shoulder functional external rotation C7 T3  Middle trapezius    Lower trapezius    Elbow flexion    Elbow extension    Wrist flexion    Wrist extension    Wrist ulnar deviation    Wrist radial deviation    Wrist pronation    Wrist supination    Grip strength (lbs)    (Blank rows = not tested)  SHOULDER SPECIAL TESTS: Rotator cuff assessment: Gerber lift off test: positive    PALPATION:  Inc resting tension of upper trap and parascap muscles, inc TTP at humeral head anterior and posterior at insertion of RC                                                                                                                             TREATMENT DATE:   09/07/24- EVAL Repeated GH extension x10: inc worse at rest Repeated GH flexion x10: dec, better at rest   PATIENT EDUCATION: Education details: Pt educated on relevant anatomy, physiology, pathology, diagnosis, prognosis, progression of care, pain and activity modification related to right shoulder pain. Person educated: Patient Education method: Explanation, Demonstration, and Handouts Education comprehension: verbalized understanding and returned demonstration  HOME EXERCISE PROGRAM: Access Code: [email or web address] URL: [email or web address] Date: 09/07/2024 Prepared by: Stann Ohara  Exercises - Shoulder Flexion Overhead with Dowel  - 4 x daily - 7 x weekly - 1 sets - 10 reps - 2 hold  ASSESSMENT:  CLINICAL IMPRESSION: Patient is a 69 y.o. F who was seen today for physical therapy evaluation and treatment for right shoulder pain. Pt symptoms are consistent with dysfunction of the  right shoulder, post inflammatory state, and impingement of glenohumeral mechanics. Pt able to demonstrate worsening of symptoms with extension principles and improves symptoms with flexion principles. Pt stands to benefit from continued skilled physical therapy to address deficit areas and restore safety with activities and participations at home and in the community.      OBJECTIVE IMPAIRMENTS: decreased ROM, decreased strength, increased fascial restrictions, impaired perceived functional ability, increased muscle spasms, impaired sensation, impaired UE functional use, and pain.   ACTIVITY LIMITATIONS: carrying, lifting, sleeping, and reach over head  PARTICIPATION LIMITATIONS: cleaning, laundry, and occupation  PERSONAL FACTORS: Age, Past/current experiences, Profession, Sex, and Time since onset of injury/illness/exacerbation are also affecting patient's functional outcome.   REHAB POTENTIAL: Good  CLINICAL DECISION MAKING: Stable/uncomplicated  EVALUATION COMPLEXITY: Low  GOALS: Goals reviewed with patient? Yes  SHORT TERM GOALS: Target date: 10/08/24   Pt will report compliance with HEP to work towards ind and home management strategies Baseline: Goal status: INITIAL   2.  Pt will score no greater than 14 on quickDASH to demonstrate improved activity tolerance Baseline:  Goal status: INITIAL   3.  Pt will improve R GH ROM to full and painless in  order to demonstrate progress towards activity tolerance and improved function Baseline:  Goal status: INITIAL     LONG TERM GOALS: Target date: 11/09/24   Pt will score no greater than 4 on quickDASH to demonstrate improved activity tolerance Baseline:  Goal status: INITIAL   2.  Pt will report no greater than 1/10 pain over 7 consecutive days to demonstrate maintained reduction in symptoms and improved tolerance to activity Baseline:  Goal status: INITIAL   3.  Pt will be ind in the management of their symptoms at home and  in the community Baseline:  Goal status: INITIAL     PLAN: PT FREQUENCY: 1-2x/week  PT DURATION: 8 weeks  PLANNED INTERVENTIONS: 97110-Therapeutic exercises, 97530- Therapeutic activity, 97112- Neuromuscular re-education, 97535- Self Care, [id number]- Manual therapy, [id number]- Electrical stimulation (unattended), [id number]- Vasopneumatic device, Patient/Family education, Cryotherapy, and Moist heat  PLAN FOR NEXT SESSION: modify HEP as needed, soft tissue mobilization, pain modulation, progress upper quarter mobility, motor control, strength and stability through functional movement patterns   Stann DELENA Ohara, PT 09/07/2024, 9:15 PM  "

## 2024-09-07 NOTE — Addendum Note (Signed)
 Addended by: CHAYA PERFECT A on: 09/07/2024 09:16 PM   Modules accepted: Orders

## 2024-09-14 ENCOUNTER — Ambulatory Visit

## 2024-09-14 DIAGNOSIS — G8929 Other chronic pain: Secondary | ICD-10-CM

## 2024-09-14 DIAGNOSIS — M6281 Muscle weakness (generalized): Secondary | ICD-10-CM

## 2024-09-14 DIAGNOSIS — M25511 Pain in right shoulder: Secondary | ICD-10-CM | POA: Diagnosis not present

## 2024-09-14 NOTE — Therapy (Signed)
 " OUTPATIENT PHYSICAL THERAPY TREATMENT NOTE   Patient Name: [name] MRN: [id number] DOB:December 01, 1955, 69 y.o., female Today's Date: 09/14/2024  END OF SESSION:  PT End of Session - 09/14/24 1358     Visit Number 2    Number of Visits 12    Date for Recertification  11/09/24    Authorization Time Period No auth needed    PT Start Time 1400    PT Stop Time 1438    PT Time Calculation (min) 38 min    Activity Tolerance Patient tolerated treatment well    Behavior During Therapy WFL for tasks assessed/performed          Past Medical History:  Diagnosis Date   Anemia    GERD (gastroesophageal reflux disease)    no meds   Past Surgical History:  Procedure Laterality Date   DILATION AND CURETTAGE OF UTERUS     MYOMECTOMY     There are no active problems to display for this patient.   PCP: Raguel Blush, MD  REFERRING PROVIDER: Kay Cummins, MD  REFERRING DIAG:  [phone] (ICD-10-CM) - Chronic right shoulder pain    THERAPY DIAG:  Chronic right shoulder pain  Muscle weakness (generalized)  Rationale for Evaluation and Treatment: Rehabilitation  ONSET DATE: Sept 2025  SUBJECTIVE:                                                                                                                                                                                      SUBJECTIVE STATEMENT: Patient reports that she did some aerobics this morning and felt the shoulder pain. She goes to the [facility] 3-5 days/week, but is not doing pickleball at this time.   EVAL: Pt states that she was playing pickleball in Sept and swung hard, fell, and landed on R shoulder. Pt saw MD and began NSAIDs regimen. At PCP visit in October, symptoms extended beyond inflammatory phase. Pt had Imaging completed November via radiographs. MRI completed beginning Dec 2025. 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 Pt had cortisone injection 08/10/24 with improvement noted. Pt states that her symptoms range in  intensity from 0/10-8/10 depending on activity or position. Pt reports symptoms inc with overhead reaching, pain with ipsilateral side lying. Improves with rest.    Hand dominance: Right  PERTINENT HISTORY: Cortisone injection helped   PAIN:  Are you having pain? Yes: NPRS scale: 0/10 Pain location: RUE to the wrist  Pain description: shooting, sharp, ache Aggravating factors: overhead reaching Relieving factors: resting  PRECAUTIONS: None  RED FLAGS: None   WEIGHT BEARING RESTRICTIONS: No  FALLS:  Has patient fallen in last 6 months? Yes. Number of falls 1  LIVING  ENVIRONMENT: Lives with: lives alone Lives in: House/apartment  OCCUPATION: Caregiver, mortgages   PLOF: Independent  PATIENT GOALS: improve overhead reaching and ROM, dec pain   NEXT MD VISIT: 10/12/24  OBJECTIVE:  Note: Objective measures were completed at Evaluation unless otherwise noted.  DIAGNOSTIC FINDINGS:  MR R Shoulder IMPRESSION: 08/04/24 1. Significant rotator cuff tendinopathy/tendinosis mainly involving the supraspinatus and infraspinatus tendons. Fairly extensive interstitial tearing along with the bursal surface tear involving the footprint attachment fibers of the supraspinatus tendon with laminar retraction estimated at 16 mm. 2. Intact long head biceps tendon and glenoid labrum. 3. Moderate thickening of the capsular structures in the axillary recess suggesting synovitis or adhesive capsulitis. 4. Moderate AC joint degenerative changes and type 2-3 acromion with mild subacromial spurring changes. 5. Mild/moderate subacromial/subdeltoid bursitis.  PATIENT SURVEYS :  Quick Dash:  QUICK DASH  Please rate your ability do the following activities in the last week by selecting the number below the appropriate response.   Activities Rating  Open a tight or new jar.  2 = Mild difficulty  Do heavy household chores (e.g., wash walls, floors). 2 = Mild difficulty  Carry a shopping bag  or briefcase 1 = No difficulty   Wash your back. 3 = Moderate difficulty  Use a knife to cut food. 1 = No difficulty   Recreational activities in which you take some force or impact through your arm, shoulder or hand (e.g., golf, hammering, tennis, etc.). 3 = Moderate difficulty  During the past week, to what extent has your arm, shoulder or hand problem interfered with your normal social activities with family, friends, neighbors or groups?  2 = Slightly  During the past week, were you limited in your work or other regular daily activities as a result of your arm, shoulder or hand problem? 2 = Slightly limited  Rate the severity of the following symptoms in the last week: Arm, Shoulder, or hand pain. 3 = Moderate  Rate the severity of the following symptoms in the last week: Tingling (pins and needles) in your arm, shoulder or hand. 2 = Mild  During the past week, how much difficulty have you had sleeping because of the pain in your arm, shoulder or hand?  3 = Moderate difficulty   (A QuickDASH score may not be calculated if there is greater than 1 missing item.)  Quick Dash Disability/Symptom Score: [(sum of 24 (n) responses/11 (n)-1] x 25 = 29.5  Minimally Clinically Important Difference (MCID): 15-20 points  (Franchignoni, F. et al. (2013). Minimally clinically important difference of the disabilities of the arm, shoulder, and hand outcome measures (DASH) and its shortened version (Quick DASH). Journal of Orthopaedic & Sports Physical Therapy, 44(1), 30-39)   COGNITION: Overall cognitive status: Within functional limits for tasks assessed     SENSATION: Light touch: WFL Reports burning sensation to the wrist with AROM abd   POSTURE: Rounded shoulders, forward head  UPPER EXTREMITY MMT:   SHOULDER ROM Right eval Left eval  Shoulder flexion 4/5 P   Shoulder extension 4+/5   Shoulder abduction 4-/5 P   Shoulder adduction 4+/5   Shoulder internal rotation 4+/5 P   Shoulder  external rotation 4-/5 P   Elbow flexion    Elbow extension    Wrist flexion    Wrist extension    Wrist ulnar deviation    Wrist radial deviation    Wrist pronation    Wrist supination    (Blank rows = not tested)  UPPER  EXTREMITY ROM: PROM flexion 145 RUE, PROM ABD RUE 90  AROM Right eval Left eval  Shoulder flexion 122 WNL  Shoulder extension 45 WNL   Shoulder abduction 90 WNL  Shoulder adduction WNL WNL  Shoulder functional internal rotation Sacrum  T5  Shoulder functional external rotation C7 T3  Middle trapezius    Lower trapezius    Elbow flexion    Elbow extension    Wrist flexion    Wrist extension    Wrist ulnar deviation    Wrist radial deviation    Wrist pronation    Wrist supination    Grip strength (lbs)    (Blank rows = not tested)  SHOULDER SPECIAL TESTS: Rotator cuff assessment: Gerber lift off test: positive    PALPATION:  Inc resting tension of upper trap and parascap muscles, inc TTP at humeral head anterior and posterior at insertion of RC                                                                                                                             TREATMENT DATE:  [facility] Adult PT Treatment:                                                DATE: 09/14/24 Therapeutic Exercise: Pulleys flex/scaption x2' ea Supine shoulder flexion with dowel x10 Supine chest press with dowel 2x10 Neuromuscular re-ed: Rows RTB 2x10 Shoulder extension RTB 2x10 Seated BIL ER with scap retraction RTB 2x10 Seated horizontal abduction RTB 2x10  09/07/24- EVAL Repeated GH extension x10: inc worse at rest Repeated GH flexion x10: dec, better at rest   PATIENT EDUCATION: Education details: Pt educated on relevant anatomy, physiology, pathology, diagnosis, prognosis, progression of care, pain and activity modification related to right shoulder pain. Person educated: Patient Education method: Explanation, Demonstration, and Handouts Education comprehension:  verbalized understanding and returned demonstration  HOME EXERCISE PROGRAM: Access Code: [email or web address] URL: [email or web address] Date: 09/07/2024 Prepared by: Stann Ohara  Exercises - Shoulder Flexion Overhead with Dowel  - 4 x daily - 7 x weekly - 1 sets - 10 reps - 2 hold  ASSESSMENT:  CLINICAL IMPRESSION: Patient presents to first follow up PT appointment reporting continued shoulder pain and that she did an aerobics class this morning and felt the pain. Session today focused on shoulder ROM and gentle strengthening within her pain tolerance. Patient was able to tolerate all prescribed exercises with no adverse effects. Patient continues to benefit from skilled PT services and should be progressed as able to improve functional independence.   EVAL: Patient is a 69 y.o. F who was seen today for physical therapy evaluation and treatment for right shoulder pain. Pt symptoms are consistent with dysfunction of the right shoulder, post inflammatory state, and impingement of glenohumeral mechanics. Pt able to demonstrate worsening of symptoms with  extension principles and improves symptoms with flexion principles. Pt stands to benefit from continued skilled physical therapy to address deficit areas and restore safety with activities and participations at home and in the community.     OBJECTIVE IMPAIRMENTS: decreased ROM, decreased strength, increased fascial restrictions, impaired perceived functional ability, increased muscle spasms, impaired sensation, impaired UE functional use, and pain.   ACTIVITY LIMITATIONS: carrying, lifting, sleeping, and reach over head  PARTICIPATION LIMITATIONS: cleaning, laundry, and occupation  PERSONAL FACTORS: Age, Past/current experiences, Profession, Sex, and Time since onset of injury/illness/exacerbation are also affecting patient's functional outcome.   REHAB POTENTIAL: Good  CLINICAL DECISION MAKING: Stable/uncomplicated  EVALUATION  COMPLEXITY: Low  GOALS: Goals reviewed with patient? Yes  SHORT TERM GOALS: Target date: 10/08/24   Pt will report compliance with HEP to work towards ind and home management strategies Baseline: Goal status: INITIAL   2.  Pt will score no greater than 14 on quickDASH to demonstrate improved activity tolerance Baseline:  Goal status: INITIAL   3.  Pt will improve R GH ROM to full and painless in order to demonstrate progress towards activity tolerance and improved function Baseline:  Goal status: INITIAL     LONG TERM GOALS: Target date: 11/09/24   Pt will score no greater than 4 on quickDASH to demonstrate improved activity tolerance Baseline:  Goal status: INITIAL   2.  Pt will report no greater than 1/10 pain over 7 consecutive days to demonstrate maintained reduction in symptoms and improved tolerance to activity Baseline:  Goal status: INITIAL   3.  Pt will be ind in the management of their symptoms at home and in the community Baseline:  Goal status: INITIAL     PLAN: PT FREQUENCY: 1-2x/week  PT DURATION: 8 weeks  PLANNED INTERVENTIONS: 97110-Therapeutic exercises, 97530- Therapeutic activity, 97112- Neuromuscular re-education, 97535- Self Care, [id number]- Manual therapy, G0283- Electrical stimulation (unattended), 97016- Vasopneumatic device, Patient/Family education, Cryotherapy, and Moist heat  PLAN FOR NEXT SESSION: modify HEP as needed, soft tissue mobilization, pain modulation, progress upper quarter mobility, motor control, strength and stability through functional movement patterns   Corean Pouch, PTA 09/14/2024, 2:38 PM  "

## 2024-09-16 ENCOUNTER — Ambulatory Visit

## 2024-09-16 DIAGNOSIS — G8929 Other chronic pain: Secondary | ICD-10-CM

## 2024-09-16 DIAGNOSIS — M25511 Pain in right shoulder: Secondary | ICD-10-CM | POA: Diagnosis not present

## 2024-09-16 DIAGNOSIS — M6281 Muscle weakness (generalized): Secondary | ICD-10-CM

## 2024-09-16 NOTE — Therapy (Signed)
 " OUTPATIENT PHYSICAL THERAPY TREATMENT NOTE   Patient Name: [name] MRN: [id number] DOB:1956/05/26, 69 y.o., female Today's Date: 09/16/2024  END OF SESSION:  PT End of Session - 09/16/24 1521     Visit Number 3    Number of Visits 12    Date for Recertification  11/09/24    Authorization Time Period No auth needed    Authorization - Visit Number 2    Authorization - Number of Visits 8    Progress Note Due on Visit 10    PT Start Time 1530    PT Stop Time 1608    PT Time Calculation (min) 38 min    Activity Tolerance Patient tolerated treatment well    Behavior During Therapy WFL for tasks assessed/performed          Past Medical History:  Diagnosis Date   Anemia    GERD (gastroesophageal reflux disease)    no meds   Past Surgical History:  Procedure Laterality Date   DILATION AND CURETTAGE OF UTERUS     MYOMECTOMY     There are no active problems to display for this patient.   PCP: Raguel Blush, MD  REFERRING PROVIDER: Kay Cummins, MD  REFERRING DIAG:  [phone] (ICD-10-CM) - Chronic right shoulder pain    THERAPY DIAG:  Chronic right shoulder pain  Muscle weakness (generalized)  Rationale for Evaluation and Treatment: Rehabilitation  ONSET DATE: Sept 2025  SUBJECTIVE:                                                                                                                                                                                      SUBJECTIVE STATEMENT: Patient reports that she didn't have much soreness after the last session. [facility] with other appointments.   EVAL: Pt states that she was playing pickleball in Sept and swung hard, fell, and landed on R shoulder. Pt saw MD and began NSAIDs regimen. At PCP visit in October, symptoms extended beyond inflammatory phase. Pt had Imaging completed November via radiographs. MRI completed beginning Dec 2025. 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 Pt had  cortisone injection 08/10/24 with improvement noted. Pt states that her symptoms range in intensity from 0/10-8/10 depending on activity or position. Pt reports symptoms inc with overhead reaching, pain with ipsilateral side lying. Improves with rest.    Hand dominance: Right  PERTINENT HISTORY: Cortisone injection helped   PAIN:  Are you having pain? Yes: NPRS scale: 0/10 Pain location: RUE to the wrist  Pain description: shooting, sharp, ache Aggravating factors: overhead reaching Relieving factors: resting  PRECAUTIONS: None  RED FLAGS: None  WEIGHT BEARING RESTRICTIONS: No  FALLS:  Has patient fallen in last 6 months? Yes. Number of falls 1  LIVING ENVIRONMENT: Lives with: lives alone Lives in: House/apartment  OCCUPATION: Caregiver, mortgages   PLOF: Independent  PATIENT GOALS: improve overhead reaching and ROM, dec pain   NEXT MD VISIT: 10/12/24  OBJECTIVE:  Note: Objective measures were completed at Evaluation unless otherwise noted.  DIAGNOSTIC FINDINGS:  MR R Shoulder IMPRESSION: 08/04/24 1. Significant rotator cuff tendinopathy/tendinosis mainly involving the supraspinatus and infraspinatus tendons. Fairly extensive interstitial tearing along with the bursal surface tear involving the footprint attachment fibers of the supraspinatus tendon with laminar retraction estimated at 16 mm. 2. Intact long head biceps tendon and glenoid labrum. 3. Moderate thickening of the capsular structures in the axillary recess suggesting synovitis or adhesive capsulitis. 4. Moderate AC joint degenerative changes and type 2-3 acromion with mild subacromial spurring changes. 5. Mild/moderate subacromial/subdeltoid bursitis.  PATIENT SURVEYS :  Quick Dash:  QUICK DASH  Please rate your ability do the following activities in the last week by selecting the number below the appropriate response.   Activities Rating  Open a tight or new jar.  2 = Mild difficulty  Do heavy  household chores (e.g., wash walls, floors). 2 = Mild difficulty  Carry a shopping bag or briefcase 1 = No difficulty   Wash your back. 3 = Moderate difficulty  Use a knife to cut food. 1 = No difficulty   Recreational activities in which you take some force or impact through your arm, shoulder or hand (e.g., golf, hammering, tennis, etc.). 3 = Moderate difficulty  During the past week, to what extent has your arm, shoulder or hand problem interfered with your normal social activities with family, friends, neighbors or groups?  2 = Slightly  During the past week, were you limited in your work or other regular daily activities as a result of your arm, shoulder or hand problem? 2 = Slightly limited  Rate the severity of the following symptoms in the last week: Arm, Shoulder, or hand pain. 3 = Moderate  Rate the severity of the following symptoms in the last week: Tingling (pins and needles) in your arm, shoulder or hand. 2 = Mild  During the past week, how much difficulty have you had sleeping because of the pain in your arm, shoulder or hand?  3 = Moderate difficulty   (A QuickDASH score may not be calculated if there is greater than 1 missing item.)  Quick Dash Disability/Symptom Score: [(sum of 24 (n) responses/11 (n)-1] x 25 = 29.5  Minimally Clinically Important Difference (MCID): 15-20 points  (Franchignoni, F. et al. (2013). Minimally clinically important difference of the disabilities of the arm, shoulder, and hand outcome measures (DASH) and its shortened version (Quick DASH). Journal of Orthopaedic & Sports Physical Therapy, 44(1), 30-39)   COGNITION: Overall cognitive status: Within functional limits for tasks assessed     SENSATION: Light touch: WFL Reports burning sensation to the wrist with AROM abd   POSTURE: Rounded shoulders, forward head  UPPER EXTREMITY MMT:   SHOULDER ROM Right eval Left eval  Shoulder flexion 4/5 P   Shoulder extension 4+/5   Shoulder abduction  4-/5 P   Shoulder adduction 4+/5   Shoulder internal rotation 4+/5 P   Shoulder external rotation 4-/5 P   Elbow flexion    Elbow extension    Wrist flexion    Wrist extension    Wrist ulnar deviation    Wrist radial  deviation    Wrist pronation    Wrist supination    (Blank rows = not tested)  UPPER EXTREMITY ROM: PROM flexion 145 RUE, PROM ABD RUE 90  AROM Right eval Left eval  Shoulder flexion 122 WNL  Shoulder extension 45 WNL   Shoulder abduction 90 WNL  Shoulder adduction WNL WNL  Shoulder functional internal rotation Sacrum  T5  Shoulder functional external rotation C7 T3  Middle trapezius    Lower trapezius    Elbow flexion    Elbow extension    Wrist flexion    Wrist extension    Wrist ulnar deviation    Wrist radial deviation    Wrist pronation    Wrist supination    Grip strength (lbs)    (Blank rows = not tested)  SHOULDER SPECIAL TESTS: Rotator cuff assessment: Gerber lift off test: positive    PALPATION:  Inc resting tension of upper trap and parascap muscles, inc TTP at humeral head anterior and posterior at insertion of RC                                                                                                                             TREATMENT DATE:  [facility] Adult PT Treatment:                                                DATE: 09/16/24 Therapeutic Exercise: Pulleys flex/scaption x2' ea Supine shoulder flexion with dowel x10 Supine chest press with dowel 2x10 Supine flexion AROM RUE 2x10 Supine chest press AROM RUE 2x10 500g ball Seated IY's x10 ea Neuromuscular re-ed: Rows RTB 2x10 Shoulder extension RTB 2x10 Seated BIL ER with scap retraction RTB 2x10 Seated horizontal abduction RTB 2x10  [facility] Adult PT Treatment:                                                DATE: 09/14/24 Therapeutic Exercise: Pulleys flex/scaption x2' ea Supine shoulder flexion with dowel x10 Supine chest press with dowel 2x10 Neuromuscular re-ed: Rows  RTB 2x10 Shoulder extension RTB 2x10 Seated BIL ER with scap retraction RTB 2x10 Seated horizontal abduction RTB 2x10  09/07/24- EVAL Repeated GH extension x10: inc worse at rest Repeated GH flexion x10: dec, better at rest   PATIENT EDUCATION: Education details: Pt educated on relevant anatomy, physiology, pathology, diagnosis, prognosis, progression of care, pain and activity modification related to right shoulder pain. Person educated: Patient Education method: Explanation, Demonstration, and Handouts Education comprehension: verbalized understanding and returned demonstration  HOME EXERCISE PROGRAM: Access Code: [email or web address] URL: [email or web address] Date: 09/16/2024 Prepared by: Corean Pouch  Exercises - Shoulder Flexion Overhead with Dowel  - 4 x daily - 7 x  weekly - 1 sets - 10 reps - 2 hold - SEATED Shoulder External Rotation and Scapular Retraction with Resistance  - 1 x daily - 7 x weekly - 2 sets - 10 reps  ASSESSMENT:  CLINICAL IMPRESSION: Patient presents to PT reporting that she had no soreness after the previous session. She notes that anything in an abduction motion or behind the back is the most difficult. Increased difficulty of some exercises today to good effect, no increase in pain during session. Initial cues provided to not elevate shoulder during exercises, she has good carryover for the rest of the session, not requiring additional cues and able to self correct. Patient continues to benefit from skilled PT services and should be progressed as able to improve functional independence.   EVAL: Patient is a 69 y.o. F who was seen today for physical therapy evaluation and treatment for right shoulder pain. Pt symptoms are consistent with dysfunction of the right shoulder, post inflammatory state, and impingement of glenohumeral mechanics. Pt able to demonstrate worsening of symptoms with extension principles and improves symptoms with flexion  principles. Pt stands to benefit from continued skilled physical therapy to address deficit areas and restore safety with activities and participations at home and in the community.     OBJECTIVE IMPAIRMENTS: decreased ROM, decreased strength, increased fascial restrictions, impaired perceived functional ability, increased muscle spasms, impaired sensation, impaired UE functional use, and pain.   ACTIVITY LIMITATIONS: carrying, lifting, sleeping, and reach over head  PARTICIPATION LIMITATIONS: cleaning, laundry, and occupation  PERSONAL FACTORS: Age, Past/current experiences, Profession, Sex, and Time since onset of injury/illness/exacerbation are also affecting patient's functional outcome.   REHAB POTENTIAL: Good  CLINICAL DECISION MAKING: Stable/uncomplicated  EVALUATION COMPLEXITY: Low  GOALS: Goals reviewed with patient? Yes  SHORT TERM GOALS: Target date: 10/08/24   Pt will report compliance with HEP to work towards ind and home management strategies Baseline: Goal status: INITIAL   2.  Pt will score no greater than 14 on quickDASH to demonstrate improved activity tolerance Baseline:  Goal status: INITIAL   3.  Pt will improve R GH ROM to full and painless in order to demonstrate progress towards activity tolerance and improved function Baseline:  Goal status: INITIAL     LONG TERM GOALS: Target date: 11/09/24   Pt will score no greater than 4 on quickDASH to demonstrate improved activity tolerance Baseline:  Goal status: INITIAL   2.  Pt will report no greater than 1/10 pain over 7 consecutive days to demonstrate maintained reduction in symptoms and improved tolerance to activity Baseline:  Goal status: INITIAL   3.  Pt will be ind in the management of their symptoms at home and in the community Baseline:  Goal status: INITIAL     PLAN: PT FREQUENCY: 1-2x/week  PT DURATION: 8 weeks  PLANNED INTERVENTIONS: 97110-Therapeutic exercises, 97530- Therapeutic  activity, 97112- Neuromuscular re-education, 97535- Self Care, 02859- Manual therapy, G0283- Electrical stimulation (unattended), 97016- Vasopneumatic device, Patient/Family education, Cryotherapy, and Moist heat  PLAN FOR NEXT SESSION: modify HEP as needed, soft tissue mobilization, pain modulation, progress upper quarter mobility, motor control, strength and stability through functional movement patterns   Corean Pouch, PTA 09/16/2024, 4:16 PM  "

## 2024-09-21 ENCOUNTER — Other Ambulatory Visit

## 2024-09-21 ENCOUNTER — Encounter: Payer: Self-pay | Admitting: Physical Therapy

## 2024-09-21 ENCOUNTER — Ambulatory Visit: Admitting: Physical Therapy

## 2024-09-21 DIAGNOSIS — G8929 Other chronic pain: Secondary | ICD-10-CM

## 2024-09-21 DIAGNOSIS — M6281 Muscle weakness (generalized): Secondary | ICD-10-CM

## 2024-09-21 DIAGNOSIS — M25511 Pain in right shoulder: Secondary | ICD-10-CM | POA: Diagnosis not present

## 2024-09-21 NOTE — Therapy (Signed)
 " OUTPATIENT PHYSICAL THERAPY TREATMENT NOTE   Patient Name: [name] MRN: [id number] DOB:1956/03/22, 69 y.o., female Today's Date: 09/21/2024  END OF SESSION:  PT End of Session - 09/21/24 1409     Visit Number 4    Number of Visits 12    Date for Recertification  11/09/24    Authorization Time Period No auth needed    Progress Note Due on Visit 10    PT Start Time 1410    PT Stop Time 1445    PT Time Calculation (min) 35 min    Activity Tolerance Patient tolerated treatment well    Behavior During Therapy WFL for tasks assessed/performed           Past Medical History:  Diagnosis Date   Anemia    GERD (gastroesophageal reflux disease)    no meds   Past Surgical History:  Procedure Laterality Date   DILATION AND CURETTAGE OF UTERUS     MYOMECTOMY     There are no active problems to display for this patient.   PCP: Raguel Blush, MD  REFERRING PROVIDER: Kay Cummins, MD  REFERRING DIAG:  [id number] (ICD-10-CM) - Chronic right shoulder pain    THERAPY DIAG:  Chronic right shoulder pain  Muscle weakness (generalized)  Rationale for Evaluation and Treatment: Rehabilitation  ONSET DATE: Sept 2025  SUBJECTIVE:                                                                                                                                                                                      SUBJECTIVE STATEMENT: Pt states that she has been doing well since her previous session and overall. Reports onset of stiffness if she does not complete activity and HEP has been helping to improve maintenance of reduction in symptoms. Current symptoms 0/10. Last noted symptoms were this morning.   EVAL: Pt states that she was playing pickleball in Sept and swung hard, fell, and landed on R shoulder. Pt saw MD and began NSAIDs regimen. At PCP visit in October, symptoms extended beyond inflammatory phase. Pt had Imaging completed November via radiographs. MRI  completed beginning Dec 2025. 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 Pt had cortisone injection 08/10/24 with improvement noted. Pt states that her symptoms range in intensity from 0/10-8/10 depending on activity or position. Pt reports symptoms inc with overhead reaching, pain with ipsilateral side lying. Improves with rest.    Hand dominance: Right  PERTINENT HISTORY: Cortisone injection helped   PAIN:  Are you having pain? Yes: NPRS scale: 0/10 Pain location: RUE to the wrist  Pain description: shooting, sharp, ache Aggravating factors: overhead reaching Relieving factors: resting  PRECAUTIONS: None  RED FLAGS: None   WEIGHT BEARING RESTRICTIONS: No  FALLS:  Has patient fallen in last 6 months? Yes. Number of falls 1  LIVING ENVIRONMENT: Lives with: lives alone Lives in: House/apartment  OCCUPATION: Caregiver, mortgages   PLOF: Independent  PATIENT GOALS: improve overhead reaching and ROM, dec pain   NEXT MD VISIT: 10/12/24  OBJECTIVE:  Note: Objective measures were completed at Evaluation unless otherwise noted.  DIAGNOSTIC FINDINGS:  MR R Shoulder IMPRESSION: 08/04/24 1. Significant rotator cuff tendinopathy/tendinosis mainly involving the supraspinatus and infraspinatus tendons. Fairly extensive interstitial tearing along with the bursal surface tear involving the footprint attachment fibers of the supraspinatus tendon with laminar retraction estimated at 16 mm. 2. Intact long head biceps tendon and glenoid labrum. 3. Moderate thickening of the capsular structures in the axillary recess suggesting synovitis or adhesive capsulitis. 4. Moderate AC joint degenerative changes and type 2-3 acromion with mild subacromial spurring changes. 5. Mild/moderate subacromial/subdeltoid bursitis.  PATIENT SURVEYS :  Quick Dash:  QUICK DASH  Please rate your ability do the following activities in the last week by selecting the number below the appropriate response.   Activities  Rating  Open a tight or new jar.  2 = Mild difficulty  Do heavy household chores (e.g., wash walls, floors). 2 = Mild difficulty  Carry a shopping bag or briefcase 1 = No difficulty   Wash your back. 3 = Moderate difficulty  Use a knife to cut food. 1 = No difficulty   Recreational activities in which you take some force or impact through your arm, shoulder or hand (e.g., golf, hammering, tennis, etc.). 3 = Moderate difficulty  During the past week, to what extent has your arm, shoulder or hand problem interfered with your normal social activities with family, friends, neighbors or groups?  2 = Slightly  During the past week, were you limited in your work or other regular daily activities as a result of your arm, shoulder or hand problem? 2 = Slightly limited  Rate the severity of the following symptoms in the last week: Arm, Shoulder, or hand pain. 3 = Moderate  Rate the severity of the following symptoms in the last week: Tingling (pins and needles) in your arm, shoulder or hand. 2 = Mild  During the past week, how much difficulty have you had sleeping because of the pain in your arm, shoulder or hand?  3 = Moderate difficulty   (A QuickDASH score may not be calculated if there is greater than 1 missing item.)  Quick Dash Disability/Symptom Score: [(sum of 24 (n) responses/11 (n)-1] x 25 = 29.5  Minimally Clinically Important Difference (MCID): 15-20 points  (Franchignoni, F. et al. (2013). Minimally clinically important difference of the disabilities of the arm, shoulder, and hand outcome measures (DASH) and its shortened version (Quick DASH). Journal of Orthopaedic & Sports Physical Therapy, 44(1), 30-39)   COGNITION: Overall cognitive status: Within functional limits for tasks assessed     SENSATION: Light touch: WFL Reports burning sensation to the wrist with AROM abd   POSTURE: Rounded shoulders, forward head  UPPER EXTREMITY MMT:   SHOULDER ROM Right eval Left eval   Shoulder flexion 4/5 P   Shoulder extension 4+/5   Shoulder abduction 4-/5 P   Shoulder adduction 4+/5   Shoulder internal rotation 4+/5 P   Shoulder external rotation 4-/5 P   Elbow flexion    Elbow extension    Wrist flexion    Wrist extension    Wrist ulnar deviation  Wrist radial deviation    Wrist pronation    Wrist supination    (Blank rows = not tested)  UPPER EXTREMITY ROM: PROM flexion 145 RUE, PROM ABD RUE 90  AROM Right eval Left eval  Shoulder flexion 122 WNL  Shoulder extension 45 WNL   Shoulder abduction 90 WNL  Shoulder adduction WNL WNL  Shoulder functional internal rotation Sacrum  T5  Shoulder functional external rotation C7 T3  Middle trapezius    Lower trapezius    Elbow flexion    Elbow extension    Wrist flexion    Wrist extension    Wrist ulnar deviation    Wrist radial deviation    Wrist pronation    Wrist supination    Grip strength (lbs)    (Blank rows = not tested)  SHOULDER SPECIAL TESTS: Rotator cuff assessment: Gerber lift off test: positive    PALPATION:  Inc resting tension of upper trap and parascap muscles, inc TTP at humeral head anterior and posterior at insertion of RC                                                                                                                             TREATMENT DATE:   [facility] Adult PT Treatment:                                                DATE: 09/21/24 Therapeutic Exercise: UBE x5 mins FW Seated W stretch with physioball x10 cycles Shrug/antishrug with scapular retraction 3x10 PNF D2 UE pattern x10 BUE alternating between reps PNF D1 UE pattern x10 BUE alternating between reps HEP revised and provided   [facility] Adult PT Treatment:                                                DATE: 09/16/24 Therapeutic Exercise: Pulleys flex/scaption x2' ea Supine shoulder flexion with dowel x10 Supine chest press with dowel 2x10 Supine flexion AROM RUE 2x10 Supine chest press AROM RUE 2x10  500g ball Seated IY's x10 ea Neuromuscular re-ed: Rows RTB 2x10 Shoulder extension RTB 2x10 Seated BIL ER with scap retraction RTB 2x10 Seated horizontal abduction RTB 2x10  [facility] Adult PT Treatment:                                                DATE: 09/14/24 Therapeutic Exercise: Pulleys flex/scaption x2' ea Supine shoulder flexion with dowel x10 Supine chest press with dowel 2x10 Neuromuscular re-ed: Rows RTB 2x10 Shoulder extension RTB 2x10 Seated BIL ER with scap retraction RTB 2x10  Seated horizontal abduction RTB 2x10  09/07/24- EVAL Repeated GH extension x10: inc worse at rest Repeated GH flexion x10: dec, better at rest   PATIENT EDUCATION: Education details: Pt educated on relevant anatomy, physiology, pathology, diagnosis, prognosis, progression of care, pain and activity modification related to right shoulder pain. Person educated: Patient Education method: Explanation, Demonstration, and Handouts Education comprehension: verbalized understanding and returned demonstration  HOME EXERCISE PROGRAM: Access Code: [email or web address] URL: [email or web address] Date: 09/21/2024 Prepared by: Stann Ohara  Exercises - Shoulder Flexion Overhead with Dowel  - 4 x daily - 7 x weekly - 1 sets - 10 reps - 2 hold - SEATED Shoulder External Rotation and Scapular Retraction with Resistance  - 1 x daily - 4 x weekly - 2 sets - 10 reps - Seated Shoulder Shrugs  - 1 x daily - 7 x weekly - 3 sets - 10 reps - 2 hold - Seated Thoracic Flexion and Rotation with Swiss Ball  - 1 x daily - 7 x weekly - 1 sets - 10 reps - 2 hold - Seated Single Arm Shoulder PNF D1 Extension  - 1 x daily - 4 x weekly - 2 sets - 10 reps - 2 hold - Shoulder PNF D2 Flexion  - 1 x daily - 4 x weekly - 2 sets - 10 reps - 2 hold  ASSESSMENT:  CLINICAL IMPRESSION: Pt has been responding well to her current regimen and has been able to demonstrate improving symptom management and restoration of function. She  was able to go to the gym this morning and worked on walking and biking. HEP modified to progress ind management of symptoms. Will plan to integrate manual techniques at next session. Pt stands to benefit from continued skilled physical therapy to address deficit areas and restore safety with activities and participations at home and in the community.    EVAL: Patient is a 69 y.o. F who was seen today for physical therapy evaluation and treatment for right shoulder pain. Pt symptoms are consistent with dysfunction of the right shoulder, post inflammatory state, and impingement of glenohumeral mechanics. Pt able to demonstrate worsening of symptoms with extension principles and improves symptoms with flexion principles. Pt stands to benefit from continued skilled physical therapy to address deficit areas and restore safety with activities and participations at home and in the community.     OBJECTIVE IMPAIRMENTS: decreased ROM, decreased strength, increased fascial restrictions, impaired perceived functional ability, increased muscle spasms, impaired sensation, impaired UE functional use, and pain.   ACTIVITY LIMITATIONS: carrying, lifting, sleeping, and reach over head  PARTICIPATION LIMITATIONS: cleaning, laundry, and occupation  PERSONAL FACTORS: Age, Past/current experiences, Profession, Sex, and Time since onset of injury/illness/exacerbation are also affecting patient's functional outcome.   REHAB POTENTIAL: Good  CLINICAL DECISION MAKING: Stable/uncomplicated  EVALUATION COMPLEXITY: Low  GOALS: Goals reviewed with patient? Yes  SHORT TERM GOALS: Target date: 10/08/24   Pt will report compliance with HEP to work towards ind and home management strategies Baseline: Goal status: INITIAL   2.  Pt will score no greater than 14 on quickDASH to demonstrate improved activity tolerance Baseline:  Goal status: INITIAL   3.  Pt will improve R GH ROM to full and painless in order to  demonstrate progress towards activity tolerance and improved function Baseline:  Goal status: INITIAL     LONG TERM GOALS: Target date: 11/09/24   Pt will score no greater than 4 on quickDASH to demonstrate improved activity tolerance Baseline:  Goal status:  INITIAL   2.  Pt will report no greater than 1/10 pain over 7 consecutive days to demonstrate maintained reduction in symptoms and improved tolerance to activity Baseline:  Goal status: INITIAL   3.  Pt will be ind in the management of their symptoms at home and in the community Baseline:  Goal status: INITIAL     PLAN: PT FREQUENCY: 1-2x/week  PT DURATION: 8 weeks  PLANNED INTERVENTIONS: 97110-Therapeutic exercises, 97530- Therapeutic activity, 97112- Neuromuscular re-education, 97535- Self Care, [id number]- Manual therapy, [id number]- Electrical stimulation (unattended), [id number]- Vasopneumatic device, Patient/Family education, Cryotherapy, and Moist heat  PLAN FOR NEXT SESSION: modify HEP as needed, soft tissue mobilization, pain modulation, progress upper quarter mobility, motor control, strength and stability through functional movement patterns   Stann DELENA Ohara, PT 09/21/2024, 2:10 PM  "

## 2024-09-23 ENCOUNTER — Ambulatory Visit: Admitting: Physical Therapy

## 2024-09-23 ENCOUNTER — Encounter: Payer: Self-pay | Admitting: Physical Therapy

## 2024-09-23 DIAGNOSIS — M25511 Pain in right shoulder: Secondary | ICD-10-CM | POA: Diagnosis not present

## 2024-09-23 DIAGNOSIS — M6281 Muscle weakness (generalized): Secondary | ICD-10-CM

## 2024-09-23 DIAGNOSIS — G8929 Other chronic pain: Secondary | ICD-10-CM

## 2024-09-23 NOTE — Therapy (Signed)
 " OUTPATIENT PHYSICAL THERAPY TREATMENT NOTE   Patient Name: [name] MRN: [id number] DOB:1955/09/19, 69 y.o., female Today's Date: 09/23/2024  END OF SESSION:  PT End of Session - 09/23/24 1407     Visit Number 5    Number of Visits 12    Date for Recertification  11/09/24    Authorization Time Period No auth needed    Authorization - Visit Number 5    Authorization - Number of Visits 8    Progress Note Due on Visit 8    PT Start Time 1405    PT Stop Time 1450    PT Time Calculation (min) 45 min    Activity Tolerance Patient tolerated treatment well    Behavior During Therapy WFL for tasks assessed/performed            Past Medical History:  Diagnosis Date   Anemia    GERD (gastroesophageal reflux disease)    no meds   Past Surgical History:  Procedure Laterality Date   DILATION AND CURETTAGE OF UTERUS     MYOMECTOMY     There are no active problems to display for this patient.   PCP: Raguel Blush, MD  REFERRING PROVIDER: Kay Cummins, MD  REFERRING DIAG:  [phone] (ICD-10-CM) - Chronic right shoulder pain    THERAPY DIAG:  Chronic right shoulder pain  Muscle weakness (generalized)  Rationale for Evaluation and Treatment: Rehabilitation  ONSET DATE: Sept 2025  SUBJECTIVE:                                                                                                                                                                                      SUBJECTIVE STATEMENT: Pt states that Tuesday evening, she noted inc pain in R shoulder and lateral RUE. Yesterday symptoms were beginning to improve in regards to pain. Continues to note limited ROM, and soreness in the right shoulder muscles. Has been completing self massage. Current symptoms no pain at rest, inc soreness and pain with movement, reaching 5/10.    EVAL: Pt states that she was playing pickleball in Sept and swung hard, fell, and landed on R shoulder. Pt saw MD and began  NSAIDs regimen. At PCP visit in October, symptoms extended beyond inflammatory phase. Pt had Imaging completed November via radiographs. MRI completed beginning Dec 2025. 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 Pt had cortisone injection 08/10/24 with improvement noted. Pt states that her symptoms range in intensity from 0/10-8/10 depending on activity or position. Pt reports symptoms inc with overhead reaching, pain with ipsilateral side lying. Improves with rest.    Hand dominance: Right  PERTINENT HISTORY: Cortisone injection helped   PAIN:  Are  you having pain? Yes: NPRS scale: 0/10 Pain location: RUE to the wrist  Pain description: shooting, sharp, ache Aggravating factors: overhead reaching Relieving factors: resting  PRECAUTIONS: None  RED FLAGS: None   WEIGHT BEARING RESTRICTIONS: No  FALLS:  Has patient fallen in last 6 months? Yes. Number of falls 1  LIVING ENVIRONMENT: Lives with: lives alone Lives in: House/apartment  OCCUPATION: Caregiver, mortgages   PLOF: Independent  PATIENT GOALS: improve overhead reaching and ROM, dec pain   NEXT MD VISIT: 10/12/24  OBJECTIVE:  Note: Objective measures were completed at Evaluation unless otherwise noted.  DIAGNOSTIC FINDINGS:  MR R Shoulder IMPRESSION: 08/04/24 1. Significant rotator cuff tendinopathy/tendinosis mainly involving the supraspinatus and infraspinatus tendons. Fairly extensive interstitial tearing along with the bursal surface tear involving the footprint attachment fibers of the supraspinatus tendon with laminar retraction estimated at 16 mm. 2. Intact long head biceps tendon and glenoid labrum. 3. Moderate thickening of the capsular structures in the axillary recess suggesting synovitis or adhesive capsulitis. 4. Moderate AC joint degenerative changes and type 2-3 acromion with mild subacromial spurring changes. 5. Mild/moderate subacromial/subdeltoid bursitis.  PATIENT SURVEYS :  Quick Dash:  QUICK  DASH  Please rate your ability do the following activities in the last week by selecting the number below the appropriate response.   Activities Rating  Open a tight or new jar.  2 = Mild difficulty  Do heavy household chores (e.g., wash walls, floors). 2 = Mild difficulty  Carry a shopping bag or briefcase 1 = No difficulty   Wash your back. 3 = Moderate difficulty  Use a knife to cut food. 1 = No difficulty   Recreational activities in which you take some force or impact through your arm, shoulder or hand (e.g., golf, hammering, tennis, etc.). 3 = Moderate difficulty  During the past week, to what extent has your arm, shoulder or hand problem interfered with your normal social activities with family, friends, neighbors or groups?  2 = Slightly  During the past week, were you limited in your work or other regular daily activities as a result of your arm, shoulder or hand problem? 2 = Slightly limited  Rate the severity of the following symptoms in the last week: Arm, Shoulder, or hand pain. 3 = Moderate  Rate the severity of the following symptoms in the last week: Tingling (pins and needles) in your arm, shoulder or hand. 2 = Mild  During the past week, how much difficulty have you had sleeping because of the pain in your arm, shoulder or hand?  3 = Moderate difficulty   (A QuickDASH score may not be calculated if there is greater than 1 missing item.)  Quick Dash Disability/Symptom Score: [(sum of 24 (n) responses/11 (n)-1] x 25 = 29.5  Minimally Clinically Important Difference (MCID): 15-20 points  (Franchignoni, F. et al. (2013). Minimally clinically important difference of the disabilities of the arm, shoulder, and hand outcome measures (DASH) and its shortened version (Quick DASH). Journal of Orthopaedic & Sports Physical Therapy, 44(1), 30-39)   COGNITION: Overall cognitive status: Within functional limits for tasks assessed     SENSATION: Light touch: WFL Reports burning  sensation to the wrist with AROM abd   POSTURE: Rounded shoulders, forward head  UPPER EXTREMITY MMT:   SHOULDER ROM Right eval Left eval  Shoulder flexion 4/5 P   Shoulder extension 4+/5   Shoulder abduction 4-/5 P   Shoulder adduction 4+/5   Shoulder internal rotation 4+/5 P  Shoulder external rotation 4-/5 P   Elbow flexion    Elbow extension    Wrist flexion    Wrist extension    Wrist ulnar deviation    Wrist radial deviation    Wrist pronation    Wrist supination    (Blank rows = not tested)  UPPER EXTREMITY ROM: PROM flexion 145 RUE, PROM ABD RUE 90  AROM Right eval Left eval  Shoulder flexion 122 WNL  Shoulder extension 45 WNL   Shoulder abduction 90 WNL  Shoulder adduction WNL WNL  Shoulder functional internal rotation Sacrum  T5  Shoulder functional external rotation C7 T3  Middle trapezius    Lower trapezius    Elbow flexion    Elbow extension    Wrist flexion    Wrist extension    Wrist ulnar deviation    Wrist radial deviation    Wrist pronation    Wrist supination    Grip strength (lbs)    (Blank rows = not tested)  SHOULDER SPECIAL TESTS: Rotator cuff assessment: Gerber lift off test: positive    PALPATION:  Inc resting tension of upper trap and parascap muscles, inc TTP at humeral head anterior and posterior at insertion of RC                                                                                                                             TREATMENT DATE:   [facility] Adult PT Treatment:                                                DATE: 09/23/24  Manual Therapy: IASTM completed to R shoulder complex. Pt developed light redness consistent with treatment goals to improve efficiency of tissue remodeling principles. Small strokes completed in line with tissue orientation with inc time spent middle deltoid and anterior arm. Pt notes inc distal nerve sensation initially, improves as treatment progresses. Adhesions noted in biceps  distribution, also improves. Pt tolerated tx well, no adverse response. Improvements in ROM and pain following Posterior glides to R shoulder in supine 3x30, initial restriction noted with grade 2-3 progressing towards grade 3 only in sets 1 and 2. Improving quality of mechanics with this intervention PROM RUE flexion in supine 3x30s, end range pain at approx 155 degrees flexion, for the last set, assisted shoulder neutral and scapular stability, reaches 170 degrees flexion.     [facility] Adult PT Treatment:                                                DATE: 09/21/24 Therapeutic Exercise: UBE x5 mins FW Seated W stretch with physioball x10 cycles Shrug/antishrug with scapular retraction 3x10 PNF D2  UE pattern x10 BUE alternating between reps PNF D1 UE pattern x10 BUE alternating between reps HEP revised and provided   [facility] Adult PT Treatment:                                                DATE: 09/16/24 Therapeutic Exercise: Pulleys flex/scaption x2' ea Supine shoulder flexion with dowel x10 Supine chest press with dowel 2x10 Supine flexion AROM RUE 2x10 Supine chest press AROM RUE 2x10 500g ball Seated IY's x10 ea Neuromuscular re-ed: Rows RTB 2x10 Shoulder extension RTB 2x10 Seated BIL ER with scap retraction RTB 2x10 Seated horizontal abduction RTB 2x10  [facility] Adult PT Treatment:                                                DATE: 09/14/24 Therapeutic Exercise: Pulleys flex/scaption x2' ea Supine shoulder flexion with dowel x10 Supine chest press with dowel 2x10 Neuromuscular re-ed: Rows RTB 2x10 Shoulder extension RTB 2x10 Seated BIL ER with scap retraction RTB 2x10 Seated horizontal abduction RTB 2x10  09/07/24- EVAL Repeated GH extension x10: inc worse at rest Repeated GH flexion x10: dec, better at rest   PATIENT EDUCATION: Education details: Pt educated on relevant anatomy, physiology, pathology, diagnosis, prognosis, progression of care, pain and activity modification  related to right shoulder pain. Person educated: Patient Education method: Explanation, Demonstration, and Handouts Education comprehension: verbalized understanding and returned demonstration  HOME EXERCISE PROGRAM: Access Code: [email or web address] URL: [email or web address] Date: 09/23/2024 Prepared by: Stann Ohara  Exercises - Shoulder Flexion Overhead with Dowel  - 4 x daily - 7 x weekly - 1 sets - 10 reps - 2 hold - SEATED Shoulder External Rotation and Scapular Retraction with Resistance  - 1 x daily - 4 x weekly - 2 sets - 10 reps - Seated Shoulder Shrugs  - 1 x daily - 7 x weekly - 3 sets - 10 reps - 2 hold - Seated Thoracic Flexion and Rotation with Swiss Ball  - 1 x daily - 7 x weekly - 1 sets - 10 reps - 2 hold - Standing Shoulder Abduction AAROM with Dowel  - 4 x daily - 7 x weekly - 1 sets - 10 reps - 2 hold - Seated Upper Trapezius Stretch  - 1 x daily - 4 x weekly - 1 sets - 5 reps - 10 hold  ASSESSMENT:  CLINICAL IMPRESSION: Pt tolerated session well, has some symptoms consistent with fatigue and pain with overhead reaching. Tissue remodeling principles integrated with tool assisted soft tissue mobilization and joint mobilizations followed by PROM. Pt reports improved sensations. Encouraged pt to complete AAROM interventions at home, added AAROM abd to RUE and removed PNF diagonals. Will reassess symptoms at next session and progress as indicated. Would benefit from further resistance training BUE below 90 degrees or through available ROM as tolerated. Pt stands to benefit from continued skilled physical therapy to address deficit areas and restore safety with activities and participations at home and in the community.     EVAL: Patient is a 69 y.o. F who was seen today for physical therapy evaluation and treatment for right shoulder pain. Pt symptoms are consistent with dysfunction of the right shoulder, post inflammatory state, and  impingement of glenohumeral  mechanics. Pt able to demonstrate worsening of symptoms with extension principles and improves symptoms with flexion principles. Pt stands to benefit from continued skilled physical therapy to address deficit areas and restore safety with activities and participations at home and in the community.     OBJECTIVE IMPAIRMENTS: decreased ROM, decreased strength, increased fascial restrictions, impaired perceived functional ability, increased muscle spasms, impaired sensation, impaired UE functional use, and pain.   ACTIVITY LIMITATIONS: carrying, lifting, sleeping, and reach over head  PARTICIPATION LIMITATIONS: cleaning, laundry, and occupation  PERSONAL FACTORS: Age, Past/current experiences, Profession, Sex, and Time since onset of injury/illness/exacerbation are also affecting patient's functional outcome.   REHAB POTENTIAL: Good  CLINICAL DECISION MAKING: Stable/uncomplicated  EVALUATION COMPLEXITY: Low  GOALS: Goals reviewed with patient? Yes  SHORT TERM GOALS: Target date: 10/08/24   Pt will report compliance with HEP to work towards ind and home management strategies Baseline: Goal status: INITIAL   2.  Pt will score no greater than 14 on quickDASH to demonstrate improved activity tolerance Baseline:  Goal status: INITIAL   3.  Pt will improve R GH ROM to full and painless in order to demonstrate progress towards activity tolerance and improved function Baseline:  Goal status: INITIAL     LONG TERM GOALS: Target date: 11/09/24   Pt will score no greater than 4 on quickDASH to demonstrate improved activity tolerance Baseline:  Goal status: INITIAL   2.  Pt will report no greater than 1/10 pain over 7 consecutive days to demonstrate maintained reduction in symptoms and improved tolerance to activity Baseline:  Goal status: INITIAL   3.  Pt will be ind in the management of their symptoms at home and in the community Baseline:  Goal status: INITIAL     PLAN: PT  FREQUENCY: 1-2x/week  PT DURATION: 8 weeks  PLANNED INTERVENTIONS: 97110-Therapeutic exercises, 97530- Therapeutic activity, 97112- Neuromuscular re-education, 97535- Self Care, [id number]- Manual therapy, [id number]- Electrical stimulation (unattended), [id number]- Vasopneumatic device, Patient/Family education, Cryotherapy, and Moist heat  PLAN FOR NEXT SESSION: modify HEP as needed, soft tissue mobilization, pain modulation, progress upper quarter mobility, motor control, strength and stability through functional movement patterns   Stann DELENA Ohara, PT 09/23/2024, 2:59 PM  "

## 2024-09-28 ENCOUNTER — Ambulatory Visit

## 2024-09-28 DIAGNOSIS — M25511 Pain in right shoulder: Secondary | ICD-10-CM | POA: Diagnosis not present

## 2024-09-28 DIAGNOSIS — G8929 Other chronic pain: Secondary | ICD-10-CM

## 2024-09-28 DIAGNOSIS — M6281 Muscle weakness (generalized): Secondary | ICD-10-CM

## 2024-09-28 NOTE — Therapy (Signed)
 " OUTPATIENT PHYSICAL THERAPY TREATMENT NOTE   Patient Name: [name] MRN: [id number] DOB:Feb 13, 1956, 69 y.o., female Today's Date: 09/28/2024  END OF SESSION:  PT End of Session - 09/28/24 1402     Visit Number 6    Number of Visits 12    Date for Recertification  11/09/24    Authorization Time Period No auth needed    Progress Note Due on Visit 8    PT Start Time 1402    PT Stop Time 1440    PT Time Calculation (min) 38 min    Activity Tolerance Patient tolerated treatment well    Behavior During Therapy WFL for tasks assessed/performed          Past Medical History:  Diagnosis Date   Anemia    GERD (gastroesophageal reflux disease)    no meds   Past Surgical History:  Procedure Laterality Date   DILATION AND CURETTAGE OF UTERUS     MYOMECTOMY     There are no active problems to display for this patient.   PCP: Raguel Blush, MD  REFERRING PROVIDER: Kay Cummins, MD  REFERRING DIAG:  [phone] (ICD-10-CM) - Chronic right shoulder pain    THERAPY DIAG:  Chronic right shoulder pain  Muscle weakness (generalized)  Rationale for Evaluation and Treatment: Rehabilitation  ONSET DATE: Sept 2025  SUBJECTIVE:                                                                                                                                                                                      SUBJECTIVE STATEMENT: Patient reports that her shoulder is feeling much better today.  EVAL: Pt states that she was playing pickleball in Sept and swung hard, fell, and landed on R shoulder. Pt saw MD and began NSAIDs regimen. At PCP visit in October, symptoms extended beyond inflammatory phase. Pt had Imaging completed November via radiographs. MRI completed beginning Dec 2025. 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 Pt had cortisone injection 08/10/24 with improvement noted. Pt states that her symptoms range in intensity from 0/10-8/10 depending on activity or position. Pt  reports symptoms inc with overhead reaching, pain with ipsilateral side lying. Improves with rest.    Hand dominance: Right  PERTINENT HISTORY: Cortisone injection helped   PAIN:  Are you having pain? Yes: NPRS scale: 0/10 Pain location: RUE to the wrist  Pain description: shooting, sharp, ache Aggravating factors: overhead reaching Relieving factors: resting  PRECAUTIONS: None  RED FLAGS: None   WEIGHT BEARING RESTRICTIONS: No  FALLS:  Has patient fallen in last 6 months? Yes. Number of falls 1  LIVING ENVIRONMENT: Lives with: lives alone Lives in: House/apartment  OCCUPATION: Caregiver,  mortgages   PLOF: Independent  PATIENT GOALS: improve overhead reaching and ROM, dec pain   NEXT MD VISIT: 10/12/24  OBJECTIVE:  Note: Objective measures were completed at Evaluation unless otherwise noted.  DIAGNOSTIC FINDINGS:  MR R Shoulder IMPRESSION: 08/04/24 1. Significant rotator cuff tendinopathy/tendinosis mainly involving the supraspinatus and infraspinatus tendons. Fairly extensive interstitial tearing along with the bursal surface tear involving the footprint attachment fibers of the supraspinatus tendon with laminar retraction estimated at 16 mm. 2. Intact long head biceps tendon and glenoid labrum. 3. Moderate thickening of the capsular structures in the axillary recess suggesting synovitis or adhesive capsulitis. 4. Moderate AC joint degenerative changes and type 2-3 acromion with mild subacromial spurring changes. 5. Mild/moderate subacromial/subdeltoid bursitis.  PATIENT SURVEYS :  Quick Dash:  QUICK DASH  Please rate your ability do the following activities in the last week by selecting the number below the appropriate response.   Activities Rating  Open a tight or new jar.  2 = Mild difficulty  Do heavy household chores (e.g., wash walls, floors). 2 = Mild difficulty  Carry a shopping bag or briefcase 1 = No difficulty   Wash your back. 3 = Moderate  difficulty  Use a knife to cut food. 1 = No difficulty   Recreational activities in which you take some force or impact through your arm, shoulder or hand (e.g., golf, hammering, tennis, etc.). 3 = Moderate difficulty  During the past week, to what extent has your arm, shoulder or hand problem interfered with your normal social activities with family, friends, neighbors or groups?  2 = Slightly  During the past week, were you limited in your work or other regular daily activities as a result of your arm, shoulder or hand problem? 2 = Slightly limited  Rate the severity of the following symptoms in the last week: Arm, Shoulder, or hand pain. 3 = Moderate  Rate the severity of the following symptoms in the last week: Tingling (pins and needles) in your arm, shoulder or hand. 2 = Mild  During the past week, how much difficulty have you had sleeping because of the pain in your arm, shoulder or hand?  3 = Moderate difficulty   (A QuickDASH score may not be calculated if there is greater than 1 missing item.)  Quick Dash Disability/Symptom Score: [(sum of 24 (n) responses/11 (n)-1] x 25 = 29.5  Minimally Clinically Important Difference (MCID): 15-20 points  (Franchignoni, F. et al. (2013). Minimally clinically important difference of the disabilities of the arm, shoulder, and hand outcome measures (DASH) and its shortened version (Quick DASH). Journal of Orthopaedic & Sports Physical Therapy, 44(1), 30-39)   COGNITION: Overall cognitive status: Within functional limits for tasks assessed     SENSATION: Light touch: WFL Reports burning sensation to the wrist with AROM abd   POSTURE: Rounded shoulders, forward head  UPPER EXTREMITY MMT:   SHOULDER ROM Right eval Left eval  Shoulder flexion 4/5 P   Shoulder extension 4+/5   Shoulder abduction 4-/5 P   Shoulder adduction 4+/5   Shoulder internal rotation 4+/5 P   Shoulder external rotation 4-/5 P   Elbow flexion    Elbow extension     Wrist flexion    Wrist extension    Wrist ulnar deviation    Wrist radial deviation    Wrist pronation    Wrist supination    (Blank rows = not tested)  UPPER EXTREMITY ROM: PROM flexion 145 RUE, PROM ABD RUE 90  AROM Right eval Left eval  Shoulder flexion 122 WNL  Shoulder extension 45 WNL   Shoulder abduction 90 WNL  Shoulder adduction WNL WNL  Shoulder functional internal rotation Sacrum  T5  Shoulder functional external rotation C7 T3  Middle trapezius    Lower trapezius    Elbow flexion    Elbow extension    Wrist flexion    Wrist extension    Wrist ulnar deviation    Wrist radial deviation    Wrist pronation    Wrist supination    Grip strength (lbs)    (Blank rows = not tested)  SHOULDER SPECIAL TESTS: Rotator cuff assessment: Gerber lift off test: positive    PALPATION:  Inc resting tension of upper trap and parascap muscles, inc TTP at humeral head anterior and posterior at insertion of RC                                                                                                                             TREATMENT DATE:  [facility] Adult PT Treatment:                                                DATE: 09/28/24 Therapeutic Exercise: UBE x6 mins FW Seated ITYs x10 Seated W stretch with physioball x10 cycles Neuromuscular Re-ed: Seated horizontal abduction YTB 2x10 Seated scapular retraction 3x10 Seated shrugs 2x10 PNF D2 UE pattern x10 BUE alternating between reps PNF D1 UE pattern x10 BUE alternating between reps   [facility] Adult PT Treatment:                                                DATE: 09/23/24  Manual Therapy: IASTM completed to R shoulder complex. Pt developed light redness consistent with treatment goals to improve efficiency of tissue remodeling principles. Small strokes completed in line with tissue orientation with inc time spent middle deltoid and anterior arm. Pt notes inc distal nerve sensation initially, improves as treatment  progresses. Adhesions noted in biceps distribution, also improves. Pt tolerated tx well, no adverse response. Improvements in ROM and pain following Posterior glides to R shoulder in supine 3x30, initial restriction noted with grade 2-3 progressing towards grade 3 only in sets 1 and 2. Improving quality of mechanics with this intervention PROM RUE flexion in supine 3x30s, end range pain at approx 155 degrees flexion, for the last set, assisted shoulder neutral and scapular stability, reaches 170 degrees flexion.     [facility] Adult PT Treatment:  DATE: 09/21/24 Therapeutic Exercise: UBE x5 mins FW Seated W stretch with physioball x10 cycles Shrug/antishrug with scapular retraction 3x10 PNF D2 UE pattern x10 BUE alternating between reps PNF D1 UE pattern x10 BUE alternating between reps HEP revised and provided    PATIENT EDUCATION: Education details: Pt educated on relevant anatomy, physiology, pathology, diagnosis, prognosis, progression of care, pain and activity modification related to right shoulder pain. Person educated: Patient Education method: Explanation, Demonstration, and Handouts Education comprehension: verbalized understanding and returned demonstration  HOME EXERCISE PROGRAM: Access Code: [email or web address] URL: [email or web address] Date: 09/23/2024 Prepared by: Stann Ohara  Exercises - Shoulder Flexion Overhead with Dowel  - 4 x daily - 7 x weekly - 1 sets - 10 reps - 2 hold - SEATED Shoulder External Rotation and Scapular Retraction with Resistance  - 1 x daily - 4 x weekly - 2 sets - 10 reps - Seated Shoulder Shrugs  - 1 x daily - 7 x weekly - 3 sets - 10 reps - 2 hold - Seated Thoracic Flexion and Rotation with Swiss Ball  - 1 x daily - 7 x weekly - 1 sets - 10 reps - 2 hold - Standing Shoulder Abduction AAROM with Dowel  - 4 x daily - 7 x weekly - 1 sets - 10 reps - 2 hold - Seated Upper Trapezius Stretch  - 1 x  daily - 4 x weekly - 1 sets - 5 reps - 10 hold  ASSESSMENT:  CLINICAL IMPRESSION: Patient presents to PT reporting improvements in her shoulder pain today. Session today focused on more exercises d/t decreased pain to good effect, she does not endorse an increase in pain during session. She reports discomfort with abduction and flexion approaching 90. Patient was able to tolerate all prescribed exercises with no adverse effects. Patient continues to benefit from skilled PT services and should be progressed as able to improve functional independence.    EVAL: Patient is a 69 y.o. F who was seen today for physical therapy evaluation and treatment for right shoulder pain. Pt symptoms are consistent with dysfunction of the right shoulder, post inflammatory state, and impingement of glenohumeral mechanics. Pt able to demonstrate worsening of symptoms with extension principles and improves symptoms with flexion principles. Pt stands to benefit from continued skilled physical therapy to address deficit areas and restore safety with activities and participations at home and in the community.     OBJECTIVE IMPAIRMENTS: decreased ROM, decreased strength, increased fascial restrictions, impaired perceived functional ability, increased muscle spasms, impaired sensation, impaired UE functional use, and pain.   ACTIVITY LIMITATIONS: carrying, lifting, sleeping, and reach over head  PARTICIPATION LIMITATIONS: cleaning, laundry, and occupation  PERSONAL FACTORS: Age, Past/current experiences, Profession, Sex, and Time since onset of injury/illness/exacerbation are also affecting patient's functional outcome.   REHAB POTENTIAL: Good  CLINICAL DECISION MAKING: Stable/uncomplicated  EVALUATION COMPLEXITY: Low  GOALS: Goals reviewed with patient? Yes  SHORT TERM GOALS: Target date: 10/08/24   Pt will report compliance with HEP to work towards ind and home management strategies Baseline: Goal status:  INITIAL   2.  Pt will score no greater than 14 on quickDASH to demonstrate improved activity tolerance Baseline:  Goal status: INITIAL   3.  Pt will improve R GH ROM to full and painless in order to demonstrate progress towards activity tolerance and improved function Baseline:  Goal status: INITIAL     LONG TERM GOALS: Target date: 11/09/24   Pt will score no greater than 4 on quickDASH  to demonstrate improved activity tolerance Baseline:  Goal status: INITIAL   2.  Pt will report no greater than 1/10 pain over 7 consecutive days to demonstrate maintained reduction in symptoms and improved tolerance to activity Baseline:  Goal status: INITIAL   3.  Pt will be ind in the management of their symptoms at home and in the community Baseline:  Goal status: INITIAL     PLAN: PT FREQUENCY: 1-2x/week  PT DURATION: 8 weeks  PLANNED INTERVENTIONS: 97110-Therapeutic exercises, 97530- Therapeutic activity, 97112- Neuromuscular re-education, 97535- Self Care, [id number]- Manual therapy, [id number]- Electrical stimulation (unattended), [id number]- Vasopneumatic device, Patient/Family education, Cryotherapy, and Moist heat  PLAN FOR NEXT SESSION: modify HEP as needed, soft tissue mobilization, pain modulation, progress upper quarter mobility, motor control, strength and stability through functional movement patterns   Corean Pouch, PTA 09/28/2024, 2:40 PM  "

## 2024-09-29 NOTE — Therapy (Signed)
 " OUTPATIENT PHYSICAL THERAPY TREATMENT NOTE/PROGRESS NOTE   Patient Name: [name] MRN: [id number] DOB:[birth year], 69 y.o., female Today's Date: 09/30/2024  END OF SESSION:  PT End of Session - 09/30/24 1412     Visit Number 7    Number of Visits 12    Date for Recertification  11/09/24    Authorization - Visit Number 7    Authorization - Number of Visits --    Progress Note Due on Visit 17    PT Start Time 1413    PT Stop Time 1512    PT Time Calculation (min) 59 min    Activity Tolerance Patient tolerated treatment well    Behavior During Therapy WFL for tasks assessed/performed           Past Medical History:  Diagnosis Date   Anemia    GERD (gastroesophageal reflux disease)    no meds   Past Surgical History:  Procedure Laterality Date   DILATION AND CURETTAGE OF UTERUS     MYOMECTOMY     There are no active problems to display for this patient.   PCP: Raguel Blush, MD  REFERRING PROVIDER: Kay Cummins, MD  REFERRING DIAG:  [phone] (ICD-10-CM) - Chronic right shoulder pain    THERAPY DIAG:  Chronic right shoulder pain  Muscle weakness (generalized)  Rationale for Evaluation and Treatment: Rehabilitation  ONSET DATE: Sept 2025  SUBJECTIVE:                                                                                                                                                                                      SUBJECTIVE STATEMENT: Pt states that she has been doing well overall since her previous session and denies pain currently, reports compliance with HEP. Pt reports overall reduction in symptoms, notes continued pain with overhead motion and RUE elevation. Pt requires adjustments to ADLs due to RUE deficits. Reports 50% improvement from initial evaluation.   EVAL: Pt states that she was playing pickleball in Sept and swung hard, fell, and landed on R shoulder. Pt saw MD and began NSAIDs regimen. At PCP visit in October,  symptoms extended beyond inflammatory phase. Pt had Imaging completed November via radiographs. MRI completed beginning Dec 2025. 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 Pt had cortisone injection 08/10/24 with improvement noted. Pt states that her symptoms range in intensity from 0/10-8/10 depending on activity or position. Pt reports symptoms inc with overhead reaching, pain with ipsilateral side lying. Improves with rest.    Hand dominance: Right  PERTINENT HISTORY: Cortisone injection helped   PAIN:  Are you having pain? Yes: NPRS scale: 0/10 Pain location: RUE to the wrist  Pain  description: shooting, sharp, ache Aggravating factors: overhead reaching Relieving factors: resting  PRECAUTIONS: None  RED FLAGS: None   WEIGHT BEARING RESTRICTIONS: No  FALLS:  Has patient fallen in last 6 months? Yes. Number of falls 1  LIVING ENVIRONMENT: Lives with: lives alone Lives in: House/apartment  OCCUPATION: Caregiver, mortgages   PLOF: Independent  PATIENT GOALS: improve overhead reaching and ROM, dec pain   NEXT MD VISIT: 10/12/24  OBJECTIVE:  Note: Objective measures were completed at Evaluation unless otherwise noted.  DIAGNOSTIC FINDINGS:  MR R Shoulder IMPRESSION: 08/04/24 1. Significant rotator cuff tendinopathy/tendinosis mainly involving the supraspinatus and infraspinatus tendons. Fairly extensive interstitial tearing along with the bursal surface tear involving the footprint attachment fibers of the supraspinatus tendon with laminar retraction estimated at 16 mm. 2. Intact long head biceps tendon and glenoid labrum. 3. Moderate thickening of the capsular structures in the axillary recess suggesting synovitis or adhesive capsulitis. 4. Moderate AC joint degenerative changes and type 2-3 acromion with mild subacromial spurring changes. 5. Mild/moderate subacromial/subdeltoid bursitis.  PATIENT SURVEYS :  Quick Dash:  QUICK DASH  Please rate your ability do the following  activities in the last week by selecting the number below the appropriate response.   Activities Rating 09/30/24  Open a tight or new jar.  2 = Mild difficulty 2  Do heavy household chores (e.g., wash walls, floors). 2 = Mild difficulty 2  Carry a shopping bag or briefcase 1 = No difficulty  2  Wash your back. 3 = Moderate difficulty 3  Use a knife to cut food. 1 = No difficulty  2  Recreational activities in which you take some force or impact through your arm, shoulder or hand (e.g., golf, hammering, tennis, etc.). 3 = Moderate difficulty 3  During the past week, to what extent has your arm, shoulder or hand problem interfered with your normal social activities with family, friends, neighbors or groups?  2 = Slightly 1  During the past week, were you limited in your work or other regular daily activities as a result of your arm, shoulder or hand problem? 2 = Slightly limited 2  Rate the severity of the following symptoms in the last week: Arm, Shoulder, or hand pain. 3 = Moderate 2  Rate the severity of the following symptoms in the last week: Tingling (pins and needles) in your arm, shoulder or hand. 2 = Mild 3  During the past week, how much difficulty have you had sleeping because of the pain in your arm, shoulder or hand?  3 = Moderate difficulty 3   (A QuickDASH score may not be calculated if there is greater than 1 missing item.)  Quick Dash Disability/Symptom Score: [(sum of 24 (n) responses/11 (n)-1] x 25 = 29.5  09/30/24: 25  Minimally Clinically Important Difference (MCID): 15-20 points  Flavio, F. et al. (2013). Minimally clinically important difference of the disabilities of the arm, shoulder, and hand outcome measures (DASH) and its shortened version (Quick DASH). Journal of Orthopaedic & Sports Physical Therapy, 44(1), 30-39)   COGNITION: Overall cognitive status: Within functional limits for tasks assessed     SENSATION: Light touch: WFL Reports burning sensation  to the wrist with AROM abd   POSTURE: Rounded shoulders, forward head  UPPER EXTREMITY MMT:   SHOULDER ROM Right eval Left eval Right 09/30/24  Shoulder flexion 4/5 P  4/5  Shoulder extension 4+/5  5/5  Shoulder abduction 4-/5 P  4+/5  Shoulder adduction 4+/5  5/5  Shoulder  internal rotation 4+/5 P  5/5  Shoulder external rotation 4-/5 P  4+/5  Elbow flexion     Elbow extension     Wrist flexion     Wrist extension     Wrist ulnar deviation     Wrist radial deviation     Wrist pronation     Wrist supination     (Blank rows = not tested)  UPPER EXTREMITY ROM:   EVAL: PROM flexion 145 RUE, PROM ABD RUE 90 09/30/24: PROM flexion 160 RUE, PROM ABD 95  AROM Right eval Left eval Right  09/30/24  Shoulder flexion 122 WNL 108  Shoulder extension 45 WNL    Shoulder abduction 90 WNL 93  Shoulder adduction WNL WNL   Shoulder functional internal rotation Sacrum  T5 L5  Shoulder functional external rotation C7 T3 C6  Middle trapezius     Lower trapezius     Elbow flexion     Elbow extension     Wrist flexion     Wrist extension     Wrist ulnar deviation     Wrist radial deviation     Wrist pronation     Wrist supination     Grip strength (lbs)     (Blank rows = not tested)  SHOULDER SPECIAL TESTS: Rotator cuff assessment: Gerber lift off test: positive    PALPATION:  Inc resting tension of upper trap and parascap muscles, inc TTP at humeral head anterior and posterior at insertion of RC                                                                                                                             TREATMENT DATE:   [facility] Adult PT Treatment:                                                DATE: 09/30/24 Therapeutic Exercise: UBE x5 mins seat 10, level 1 forwards only OH chop x15 with blue TB RUE  HEP revised and provided  Pec stretch x10 RUE in ER 90/90, 30 degrees abd, and 60 degrees abd. Pt has optimal response with 60 degrees abd   Therapeutic  Activity: PT POC reviewed and discussed, goals assessed and updated to reflect current status. Tests and measures.    [facility] Adult PT Treatment:                                                DATE: 09/28/24 Therapeutic Exercise: UBE x6 mins FW Seated ITYs x10 Seated W stretch with physioball x10 cycles Neuromuscular Re-ed: Seated horizontal abduction YTB 2x10 Seated scapular retraction 3x10 Seated shrugs 2x10 PNF D2 UE pattern x10 BUE alternating between reps  PNF D1 UE pattern x10 BUE alternating between reps   [facility] Adult PT Treatment:                                                DATE: 09/23/24  Manual Therapy: IASTM completed to R shoulder complex. Pt developed light redness consistent with treatment goals to improve efficiency of tissue remodeling principles. Small strokes completed in line with tissue orientation with inc time spent middle deltoid and anterior arm. Pt notes inc distal nerve sensation initially, improves as treatment progresses. Adhesions noted in biceps distribution, also improves. Pt tolerated tx well, no adverse response. Improvements in ROM and pain following Posterior glides to R shoulder in supine 3x30, initial restriction noted with grade 2-3 progressing towards grade 3 only in sets 1 and 2. Improving quality of mechanics with this intervention PROM RUE flexion in supine 3x30s, end range pain at approx 155 degrees flexion, for the last set, assisted shoulder neutral and scapular stability, reaches 170 degrees flexion.     [facility] Adult PT Treatment:                                                DATE: 09/21/24 Therapeutic Exercise: UBE x5 mins FW Seated W stretch with physioball x10 cycles Shrug/antishrug with scapular retraction 3x10 PNF D2 UE pattern x10 BUE alternating between reps PNF D1 UE pattern x10 BUE alternating between reps HEP revised and provided    PATIENT EDUCATION: Education details: Pt educated on relevant anatomy, physiology, pathology,  diagnosis, prognosis, progression of care, pain and activity modification related to right shoulder pain. Person educated: Patient Education method: Explanation, Demonstration, and Handouts Education comprehension: verbalized understanding and returned demonstration  HOME EXERCISE PROGRAM: Access Code: [email or web address] URL: [email or web address] Date: 09/23/2024 Prepared by: Stann Ohara  Exercises - Shoulder Flexion Overhead with Dowel  - 4 x daily - 7 x weekly - 1 sets - 10 reps - 2 hold - SEATED Shoulder External Rotation and Scapular Retraction with Resistance  - 1 x daily - 4 x weekly - 2 sets - 10 reps - Seated Shoulder Shrugs  - 1 x daily - 7 x weekly - 3 sets - 10 reps - 2 hold - Seated Thoracic Flexion and Rotation with Swiss Ball  - 1 x daily - 7 x weekly - 1 sets - 10 reps - 2 hold - Standing Shoulder Abduction AAROM with Dowel  - 4 x daily - 7 x weekly - 1 sets - 10 reps - 2 hold - Seated Upper Trapezius Stretch  - 1 x daily - 4 x weekly - 1 sets - 5 reps - 10 hold - OH chop 3x10-15, 4-7 times per week  ASSESSMENT:  CLINICAL IMPRESSION: Pt progress report completed today, this is 7th visit since start of PT POC on 09/07/24. Pt has been better able to tolerate activity, remains limited in ROM, PROM values > AROM values. HEP revised to address ROM deficits and tolerance to activity. Strength has improved since beginning therapy. Cortisone injection prior to therapy eval acknowledged. Pt to trial continued therapy at 1-2 times per week for the next 2-3 weeks as trial to this frequency based on timeframe of healing. Pt may benefit from HEP and  decreased frequency based on response in the next 3 weeks.    EVAL: Patient is a 69 y.o. F who was seen today for physical therapy evaluation and treatment for right shoulder pain. Pt symptoms are consistent with dysfunction of the right shoulder, post inflammatory state, and impingement of glenohumeral mechanics. Pt able to demonstrate  worsening of symptoms with extension principles and improves symptoms with flexion principles. Pt stands to benefit from continued skilled physical therapy to address deficit areas and restore safety with activities and participations at home and in the community.     OBJECTIVE IMPAIRMENTS: decreased ROM, decreased strength, increased fascial restrictions, impaired perceived functional ability, increased muscle spasms, impaired sensation, impaired UE functional use, and pain.   ACTIVITY LIMITATIONS: carrying, lifting, sleeping, and reach over head  PARTICIPATION LIMITATIONS: cleaning, laundry, and occupation  PERSONAL FACTORS: Age, Past/current experiences, Profession, Sex, and Time since onset of injury/illness/exacerbation are also affecting patient's functional outcome.   REHAB POTENTIAL: Good  CLINICAL DECISION MAKING: Stable/uncomplicated  EVALUATION COMPLEXITY: Low  GOALS: Goals reviewed with patient? Yes  SHORT TERM GOALS: Target date: 10/08/24   Pt will report compliance with HEP to work towards ind and home management strategies Baseline: Goal status: IN PROGRESS   2.  Pt will score no greater than 14 on quickDASH to demonstrate improved activity tolerance Baseline:  Goal status: IN PROGRESS   3.  Pt will improve R GH ROM to full and painless in order to demonstrate progress towards activity tolerance and improved function Baseline:  Goal status: IN PROGRESS     LONG TERM GOALS: Target date: 11/09/24   Pt will score no greater than 4 on quickDASH to demonstrate improved activity tolerance Baseline:  09/30/24:  Goal status: IN PROGRESS   2.  Pt will report no greater than 1/10 pain over 7 consecutive days to demonstrate maintained reduction in symptoms and improved tolerance to activity Baseline:  09/30/24: 0/10-5/10 Goal status: IN PROGRESS   3.  Pt will be ind in the management of their symptoms at home and in the community Baseline:  09/30/24: 50% improvement  from initial eval Goal status: IN PROGRESS     PLAN: PT FREQUENCY: 1-2x/week  PT DURATION: 8 weeks  PLANNED INTERVENTIONS: 97110-Therapeutic exercises, 97530- Therapeutic activity, 97112- Neuromuscular re-education, 97535- Self Care, [id number]- Manual therapy, [id number]- Electrical stimulation (unattended), [id number]- Vasopneumatic device, Patient/Family education, Cryotherapy, and Moist heat  PLAN FOR NEXT SESSION: modify HEP as needed, soft tissue mobilization, joint mobilization, pain modulation, progress upper quarter mobility, motor control, strength and stability through functional movement patterns   Stann DELENA Ohara, PT 09/30/2024, 4:34 PM  "

## 2024-09-30 ENCOUNTER — Encounter: Payer: Self-pay | Admitting: Physical Therapy

## 2024-09-30 ENCOUNTER — Ambulatory Visit: Admitting: Physical Therapy

## 2024-09-30 ENCOUNTER — Encounter: Payer: Self-pay | Admitting: Family Medicine

## 2024-09-30 DIAGNOSIS — G8929 Other chronic pain: Secondary | ICD-10-CM

## 2024-09-30 DIAGNOSIS — M6281 Muscle weakness (generalized): Secondary | ICD-10-CM

## 2024-09-30 DIAGNOSIS — M25511 Pain in right shoulder: Secondary | ICD-10-CM | POA: Diagnosis not present

## 2024-10-05 ENCOUNTER — Ambulatory Visit: Attending: Orthopaedic Surgery

## 2024-10-05 DIAGNOSIS — G8929 Other chronic pain: Secondary | ICD-10-CM

## 2024-10-05 DIAGNOSIS — M6281 Muscle weakness (generalized): Secondary | ICD-10-CM

## 2024-10-12 ENCOUNTER — Ambulatory Visit: Admitting: Orthopaedic Surgery

## 2024-10-13 ENCOUNTER — Ambulatory Visit

## 2024-10-19 ENCOUNTER — Ambulatory Visit

## 2024-10-28 ENCOUNTER — Ambulatory Visit

## 2024-11-02 ENCOUNTER — Ambulatory Visit

## 2025-01-06 ENCOUNTER — Ambulatory Visit: Admitting: Family Medicine
